# Patient Record
Sex: Male | Born: 1970 | Race: White | Hispanic: No | Marital: Married | Smoking: Current every day smoker
Health system: Southern US, Community
[De-identification: ages and names within clinical notes are randomized; demographics above are authoritative.]

## PROBLEM LIST (undated history)

## (undated) DIAGNOSIS — J449 Chronic obstructive pulmonary disease, unspecified: Secondary | ICD-10-CM

## (undated) DIAGNOSIS — F32A Depression, unspecified: Secondary | ICD-10-CM

## (undated) HISTORY — DX: Depression, unspecified: F32.A

---

## 1993-05-15 HISTORY — PX: BACK SURGERY: SHX140

## 2019-01-01 ENCOUNTER — Emergency Department
Admission: EM | Admit: 2019-01-01 | Discharge: 2019-01-01 | Disposition: A | Discharge status: Home / Self Care | Payer: 59 | Attending: Emergency Medicine | Admitting: Emergency Medicine

## 2019-01-01 ENCOUNTER — Encounter: Payer: Self-pay | Admitting: Emergency Medicine

## 2019-01-01 ENCOUNTER — Other Ambulatory Visit: Payer: Self-pay

## 2019-01-01 DIAGNOSIS — M545 Low back pain: Secondary | ICD-10-CM | POA: Diagnosis present

## 2019-01-01 DIAGNOSIS — M5136 Other intervertebral disc degeneration, lumbar region: Secondary | ICD-10-CM

## 2019-01-01 DIAGNOSIS — M62830 Muscle spasm of back: Secondary | ICD-10-CM | POA: Diagnosis not present

## 2019-01-01 MED ORDER — HYDROMORPHONE HCL 1 MG/ML IJ SOLN
1.0000 mg | Freq: Once | INTRAMUSCULAR | Status: AC
  Administered 2019-01-01: 1 mg via INTRAMUSCULAR
  Filled 2019-01-01: qty 1

## 2019-01-01 MED ORDER — METHOCARBAMOL 500 MG PO TABS
500.0000 mg | ORAL_TABLET | Freq: Once | ORAL | Status: AC
  Administered 2019-01-01: 500 mg via ORAL
  Filled 2019-01-01 (×2): qty 1

## 2019-01-01 MED ORDER — KETOROLAC TROMETHAMINE 60 MG/2ML IM SOLN
60.0000 mg | Freq: Once | INTRAMUSCULAR | Status: AC
  Administered 2019-01-01: 60 mg via INTRAMUSCULAR
  Filled 2019-01-01: qty 2

## 2019-01-01 MED ORDER — IBUPROFEN 600 MG PO TABS: 600.0000 mg | ORAL_TABLET | Freq: Three times a day (TID) | ORAL | 0 refills | Status: DC | PRN

## 2019-01-01 MED ORDER — DEXAMETHASONE SODIUM PHOSPHATE 10 MG/ML IJ SOLN
10.0000 mg | Freq: Once | INTRAMUSCULAR | Status: AC
  Administered 2019-01-01: 10 mg via INTRAMUSCULAR
  Filled 2019-01-01: qty 1

## 2019-01-01 MED ORDER — METHOCARBAMOL 500 MG PO TABS: 500.0000 mg | ORAL_TABLET | Freq: Three times a day (TID) | ORAL | 0 refills | Status: DC | PRN

## 2019-01-01 NOTE — ED Provider Notes (Signed)
[facility] Emergency Department Provider Note  ____________________________________________   First MD Initiated Contact with Patient 01/01/19 0147     (approximate)  I have reviewed the triage vital signs and the nursing notes.   HISTORY  Chief Complaint Back Pain    HPI Eric Soto is a 48 y.o. male  Here with back pain. Pt reports that his sx began this afternoon. He reports he has a long ho back pain 2/2 lumbar disc disease. He has been working more lately and this afternoon, developed gradual onset of progressively worsening aching, throbbing, bilateral back pain. He feels spasm like pain of his b/l paraspinal muscles and it shoots into his left leg, which is common during these episodes. Denies any new LE weakness, numbness. No loss of bowel or bladder continence. No recent falls or trauma. No perianal numbness. No fever, chills. No h/o malignancy or IVDU.        History reviewed. No pertinent past medical history.  There are no active problems to display for this patient.   Past Surgical History:  Procedure Laterality Date   BACK SURGERY      Prior to Admission medications   Medication Sig Start Date End Date Taking? Authorizing Provider  ibuprofen (ADVIL) 600 MG tablet Take 1 tablet (600 mg total) by mouth every 8 (eight) hours as needed for moderate pain (back pain). 01/01/19   Shaune PollackIsaacs, Aviraj Kentner, MD  methocarbamol (ROBAXIN) 500 MG tablet Take 1 tablet (500 mg total) by mouth every 8 (eight) hours as needed for muscle spasms. 01/01/19   Shaune PollackIsaacs, Balin Vandegrift, MD    Allergies Penicillins  No family history on file.  Social History Social History   Tobacco Use   Smoking status: Not on file  Substance Use Topics   Alcohol use: Not on file   Drug use: Not on file    Review of Systems  Review of Systems  Constitutional: Negative for chills, fatigue and fever.  HENT: Negative for sore throat.   Respiratory: Negative for  shortness of breath.   Cardiovascular: Negative for chest pain.  Gastrointestinal: Negative for abdominal pain.  Genitourinary: Negative for flank pain.  Musculoskeletal: Positive for arthralgias and back pain. Negative for neck pain.  Skin: Negative for rash and wound.  Allergic/Immunologic: Negative for immunocompromised state.  Neurological: Negative for weakness and numbness.  Hematological: Does not bruise/bleed easily.  All other systems reviewed and are negative.    ____________________________________________  PHYSICAL EXAM:      VITAL SIGNS: ED Triage Vitals  Enc Vitals Group     BP 01/01/19 0026 115/73     Pulse Rate 01/01/19 0026 78     Resp 01/01/19 0026 18     Temp 01/01/19 0026 97.9 F (36.6 C)     Temp Source 01/01/19 0026 Oral     SpO2 01/01/19 0026 98 %     Weight 01/01/19 0027 195 lb (88.5 kg)     Height 01/01/19 0027 5\' 11"  (1.803 m)     Head Circumference --      Peak Flow --      Pain Score 01/01/19 0025 8     Pain Loc --      Pain Edu? --      Excl. in GC? --      Physical Exam Vitals signs and nursing note reviewed.  Constitutional:      General: He is not in acute distress.    Appearance: He is well-developed.  HENT:  Head: Normocephalic and atraumatic.  Eyes:     Conjunctiva/sclera: Conjunctivae normal.  Neck:     Musculoskeletal: Neck supple.  Cardiovascular:     Rate and Rhythm: Normal rate and regular rhythm.     Heart sounds: Normal heart sounds.  Pulmonary:     Effort: Pulmonary effort is normal. No respiratory distress.     Breath sounds: No wheezing.  Abdominal:     General: There is no distension.  Skin:    General: Skin is warm.     Capillary Refill: Capillary refill takes less than 2 seconds.     Findings: No rash.  Neurological:     Mental Status: He is alert and oriented to person, place, and time.     Motor: No abnormal muscle tone.      Spine Exam: Inspection/Palpation: Left > right paraspinal muscle lumbar  spasm, with significant TTP.  Strength: 5/5 throughout LE bilaterally (hip flexion/extension, adduction/abduction; knee flexion/extension; foot dorsiflexion/plantarflexion, inversion/eversion; great toe inversion) Sensation: Intact to light touch in proximal and distal LE bilaterally Reflexes: 2+ quadriceps and achilles reflexes  ___________________________________________   LABS (all labs ordered are listed, but only abnormal results are displayed)  Labs Reviewed - No data to display  ____________________________________________  EKG: None ________________________________________  RADIOLOGY All imaging, including plain films, CT scans, and ultrasounds, independently reviewed by me, and interpretations confirmed via formal radiology reads.  ED MD interpretation:   None  Official radiology report(s): No results found.  ____________________________________________  PROCEDURES   Procedure(s) performed (including Critical Care):  Procedures  ____________________________________________  INITIAL IMPRESSION / MDM / ASSESSMENT AND PLAN / ED COURSE  As part of my medical decision making, I reviewed the following data within the electronic MEDICAL RECORD NUMBER Notes from prior ED visits and [place] Controlled Substance Database      *Eric Soto was evaluated in Emergency Department on 01/01/2019 for the symptoms described in the history of present illness. He was evaluated in the context of the global COVID-19 pandemic, which necessitated consideration that the patient might be at risk for infection with the SARS-CoV-2 virus that causes COVID-19. Institutional protocols and algorithms that pertain to the evaluation of patients at risk for COVID-19 are in a state of rapid change based on information released by regulatory bodies including the CDC and federal and state organizations. These policies and algorithms were followed during the patient's care in the ED.  Some ED evaluations  and interventions may be delayed as a result of limited staffing during the pandemic.*   Clinical Course as of Jan 01 235  Wed Jan 01, 2019  0204 48 yo M here with paraspinal back spasm and pain, with h/o lumbar disc disease s/p laminectomies. No new LE weakness, numbness, or sx to suggest new cord compression or cauda equina. He is afebrile w/ no signs of infection. No recent falls, trauma to suggest bony abnormality and do not feel plain films indicated. H/o similar episodes from light lifting episodes. Will tx with analgesics, muscle relaxants and refer for outpt follow-up. No red flags.   [CI]    Clinical Course User Index [CI] Shaune PollackIsaacs, Sequoyah Ramone, MD    Medical Decision Making: As above.  ____________________________________________  FINAL CLINICAL IMPRESSION(S) / ED DIAGNOSES  Final diagnoses:  Paraspinal muscle spasm  Degenerative lumbar disc     MEDICATIONS GIVEN DURING THIS VISIT:  Medications  HYDROmorphone (DILAUDID) injection 1 mg (has no administration in time range)  ketorolac (TORADOL) injection 60 mg (has no administration in  time range)  methocarbamol (ROBAXIN) tablet 500 mg (has no administration in time range)  dexamethasone (DECADRON) injection 10 mg (has no administration in time range)     ED Discharge Orders         Ordered    ibuprofen (ADVIL) 600 MG tablet  Every 8 hours PRN     01/01/19 0229    methocarbamol (ROBAXIN) 500 MG tablet  Every 8 hours PRN     01/01/19 0229           Note:  This document was prepared using Dragon voice recognition software and may include unintentional dictation errors.   Duffy Bruce, MD 01/01/19 [id number]

## 2019-01-01 NOTE — ED Triage Notes (Signed)
Patient ambulatory to triage with steady gait, without difficulty or distress noted, mask in place; pt reports back spasms today; st hx lower back surgeries and "happens once or twice a year"; denies any specifc injury but reports "may have aggravated it at work"

## 2020-09-14 ENCOUNTER — Encounter: Payer: Self-pay | Admitting: Emergency Medicine

## 2020-09-14 ENCOUNTER — Other Ambulatory Visit: Payer: Self-pay

## 2020-09-14 ENCOUNTER — Ambulatory Visit
Admission: EM | Admit: 2020-09-14 | Discharge: 2020-09-14 | Disposition: A | Discharge status: Home / Self Care | Payer: BC Managed Care – PPO

## 2020-09-14 ENCOUNTER — Ambulatory Visit (INDEPENDENT_AMBULATORY_CARE_PROVIDER_SITE_OTHER): Payer: BC Managed Care – PPO

## 2020-09-14 DIAGNOSIS — R059 Cough, unspecified: Secondary | ICD-10-CM | POA: Diagnosis not present

## 2020-09-14 DIAGNOSIS — R0602 Shortness of breath: Secondary | ICD-10-CM

## 2020-09-14 DIAGNOSIS — R911 Solitary pulmonary nodule: Secondary | ICD-10-CM | POA: Diagnosis not present

## 2020-09-14 DIAGNOSIS — J441 Chronic obstructive pulmonary disease with (acute) exacerbation: Secondary | ICD-10-CM | POA: Diagnosis not present

## 2020-09-14 HISTORY — DX: Chronic obstructive pulmonary disease, unspecified: J44.9

## 2020-09-14 MED ORDER — ALBUTEROL SULFATE HFA 108 (90 BASE) MCG/ACT IN AERS: 2.0000 | INHALATION_SPRAY | Freq: Four times a day (QID) | RESPIRATORY_TRACT | 1 refills | Status: DC | PRN

## 2020-09-14 MED ORDER — AEROCHAMBER MV MISC: 2 refills | Status: AC

## 2020-09-14 MED ORDER — AZITHROMYCIN 250 MG PO TABS: 250.0000 mg | ORAL_TABLET | Freq: Every day | ORAL | 0 refills | Status: DC

## 2020-09-14 MED ORDER — BUDESONIDE-FORMOTEROL FUMARATE 80-4.5 MCG/ACT IN AERO: 2.0000 | INHALATION_SPRAY | Freq: Two times a day (BID) | RESPIRATORY_TRACT | 12 refills | Status: DC

## 2020-09-14 MED ORDER — IPRATROPIUM BROMIDE 0.06 % NA SOLN: 2.0000 | Freq: Four times a day (QID) | NASAL | 12 refills | Status: AC

## 2020-09-14 NOTE — ED Triage Notes (Addendum)
Patient states he has a history of COPD. He states "I have severe fluid in my lungs." He is reporting SOB and states he has been doing his inhaler and his breathing treatments with no relief x 2 weeks.

## 2020-09-14 NOTE — ED Provider Notes (Signed)
[facility]    CSN: [id number] Arrival date & time: 09/14/20  1502      History   Chief Complaint Chief Complaint  Patient presents with  . Shortness of Breath    HPI Eric Soto is a 50 y.o. male.   HPI   50 year old male here for evaluation of shortness of breath.  Patient reports that he has been having increasing shortness of breath for the past 2 weeks.  He has been using his albuterol inhaler and his nebulizer without any relief.  Patient reports that he has been using his inhaler up to once an hour.  He states that he has had a runny nose with nasal congestion and a cough that happens when he lays down at night.  He is also had intermittent wheezing.  Patient denies fever or sore throat.  Patient has a diagnosis of COPD and still smokes 1/2 to 1 pack/day.  Have PCP or pulmonologist.  Past Medical History:  Diagnosis Date  . COPD (chronic obstructive pulmonary disease) (HCC)     There are no problems to display for this patient.   Past Surgical History:  Procedure Laterality Date  . BACK SURGERY         Home Medications    Prior to Admission medications   Medication Sig Start Date End Date Taking? Authorizing Provider  albuterol (ACCUNEB) 1.25 MG/3ML nebulizer solution Take 1 ampule by nebulization every 6 (six) hours as needed for wheezing.   Yes [provider]  azithromycin (ZITHROMAX Z-PAK) 250 MG tablet Take 1 tablet (250 mg total) by mouth daily. Take 2 tablets on the first day and then 1 tablet daily thereafter for a total of 5 days of treatment. 09/14/20  Yes Becky Augusta, NP  budesonide-formoterol [facility]) 80-4.5 MCG/ACT inhaler Inhale 2 puffs into the lungs 2 (two) times daily. 09/14/20  Yes Becky Augusta, NP  ipratropium (ATROVENT) 0.06 % nasal spray Place 2 sprays into both nostrils 4 (four) times daily. 09/14/20  Yes Becky Augusta, NP  Spacer/Aero-Holding Deretha Emory (AEROCHAMBER MV) inhaler Use as instructed 09/14/20  Yes Becky Augusta, NP  albuterol (VENTOLIN HFA) 108 (90 Base) MCG/ACT inhaler Inhale 2 puffs into the lungs every 6 (six) hours as needed for wheezing or shortness of breath. 09/14/20   Becky Augusta, NP    Family History History reviewed. No pertinent family history.  Social History Social History   Tobacco Use  . Smoking status: Current Every Day Smoker    Packs/day: 1.00  . Smokeless tobacco: Never Used  Substance Use Topics  . Alcohol use: Yes  . Drug use: Yes    Types: Marijuana     Allergies   Penicillins   Review of Systems Review of Systems  Constitutional: Negative for activity change, appetite change and fever.  HENT: Positive for congestion, postnasal drip and rhinorrhea. Negative for sore throat.   Respiratory: Positive for cough, shortness of breath and wheezing.   Cardiovascular: Negative for chest pain.  Hematological: Negative.   Psychiatric/Behavioral: Negative.      Physical Exam Triage Vital Signs ED Triage Vitals  Enc Vitals Group     BP 09/14/20 1513 (!) 156/110     Pulse Rate 09/14/20 1513 72     Resp 09/14/20 1513 18     Temp 09/14/20 1513 98.3 F (36.8 C)     Temp Source 09/14/20 1513 Oral     SpO2 09/14/20 1513 99 %     Weight 09/14/20 1510 185 lb (  83.9 kg)     Height 09/14/20 1510 5\' 11"  (1.803 m)     Head Circumference --      Peak Flow --      Pain Score 09/14/20 1510 0     Pain Loc --      Pain Edu? --      Excl. in GC? --    No data found.  Updated Vital Signs BP (!) 156/110 (BP Location: Left Arm)   Pulse 72   Temp 98.3 F (36.8 C) (Oral)   Resp 18   Ht 5\' 11"  (1.803 m)   Wt 185 lb (83.9 kg)   SpO2 99%   BMI 25.80 kg/m   Visual Acuity Right Eye Distance:   Left Eye Distance:   Bilateral Distance:    Right Eye Near:   Left Eye Near:    Bilateral Near:     Physical Exam Vitals and nursing note reviewed.  Constitutional:      General: He is not in acute distress.    Appearance: Normal appearance. He is well-developed and  normal weight. He is not ill-appearing.  HENT:     Head: Normocephalic and atraumatic.     Right Ear: Tympanic membrane, ear canal and external ear normal.     Left Ear: Tympanic membrane, ear canal and external ear normal.     Nose: Congestion and rhinorrhea present.     Mouth/Throat:     Mouth: Mucous membranes are moist.     Pharynx: Posterior oropharyngeal erythema present.  Cardiovascular:     Rate and Rhythm: Normal rate and regular rhythm.     Pulses: Normal pulses.     Heart sounds: Normal heart sounds. No murmur heard. No gallop.   Pulmonary:     Effort: Pulmonary effort is normal.     Breath sounds: Wheezing present. No rales.  Musculoskeletal:     Cervical back: Normal range of motion and neck supple.  Lymphadenopathy:     Cervical: No cervical adenopathy.  Skin:    General: Skin is warm and dry.     Capillary Refill: Capillary refill takes less than 2 seconds.     Findings: No erythema or rash.  Neurological:     General: No focal deficit present.     Mental Status: He is alert and oriented to person, place, and time.  Psychiatric:        Mood and Affect: Mood normal.        Behavior: Behavior normal.        Thought Content: Thought content normal.        Judgment: Judgment normal.      UC Treatments / Results  Labs (all labs ordered are listed, but only abnormal results are displayed) Labs Reviewed - No data to display  EKG   Radiology DG Chest 2 View  Result Date: 09/14/2020 CLINICAL DATA:  Cough.  Shortness of breath. EXAM: CHEST - 2 VIEW COMPARISON:  No prior. FINDINGS: Mediastinum and hilar structures normal. Heart size normal. Calcified pulmonary nodules consistent with granulomas. No pleural effusion or pneumothorax. IMPRESSION: No acute cardiopulmonary disease. Calcified pulmonary nodules consistent with granulomas. Electronically Signed   By:  Register   On: 09/14/2020 15:43    Procedures Procedures (including critical care  time)  Medications Ordered in UC Medications - No data to display  Initial Impression / Assessment and Plan / UC Course  I have reviewed the triage vital signs and the nursing notes.  Pertinent labs &  imaging results that were available during my care of the patient were reviewed by me and considered in my medical decision making (see chart for details).   Patient is here for evaluation of increasing shortness of breath has been going on for the past 2 weeks and not responding to his nebulizer and albuterol MDI treatments at home.  He is using his MDI up to every hour because he cannot catch his breath.  He has had a cough but he states it mainly starts when he lays down at night because of postnasal drip.  He has had some associated runny nose and nasal congestion and wheezing.  Patient is a smoker and carries a diagnosis of COPD but is not currently being managed by primary care provider or pulmonology.  Patient's physical exam reveals pearly gray tympanic membranes bilaterally with a normal light reflex and clear external auditory canals.  Nasal mucosa is pale and edematous with clear nasal discharge.  Posterior oropharynx has mild erythema with clear postnasal drip.  No cervical lymphadenopathy.  Patient does have wheezes in all lung fields.  Chest x-ray collected at triage.  Radiology interpretation of chest x-ray is that it it is negative for acute intrathoracic process.  There is the presence of calcified pulmonary nodules most consistent with granulomas.  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  Patient's exam is consistent with a COPD exacerbation.  Will cover patient for potential bacterial sources with azithromycin, refill his albuterol MDI, provide prescription for spacer and Symbicort.  We will also make referral to find patient a primary care provider to manage his COPD.   Final Clinical Impressions(s) / UC Diagnoses   Final  diagnoses:  COPD exacerbation [facility])     Discharge Instructions     Your exam and x-rays today are consistent with a COPD exacerbation.  Take the azithromycin as directed, 2 tablets today followed by 1 tablet each day thereafter for total of 5 days.  Continue to use the albuterol inhaler, with the spacer, 2 puffs every 4-6 hours as needed for shortness of breath.  Use the Symbicort inhaler with your spacer, 2 puffs twice daily, to decrease inflammation in your lungs and provide better control of your COPD.  Rinse your mouth or brush her teeth after use the Symbicort as it has a steroid which can cause tooth decay or thrush.  I have made a referral to our coordinator to help you find a primary care provider to manage your COPD.    ED Prescriptions    Medication Sig Dispense Auth. Provider   albuterol (VENTOLIN HFA) 108 (90 Base) MCG/ACT inhaler Inhale 2 puffs into the lungs every 6 (six) hours as needed for wheezing or shortness of breath. 18 g Becky Augusta, NP   Spacer/Aero-Holding Chambers (AEROCHAMBER MV) inhaler Use as instructed 1 each Becky Augusta, NP   ipratropium (ATROVENT) 0.06 % nasal spray Place 2 sprays into both nostrils 4 (four) times daily. 15 mL Becky Augusta, NP   budesonide-formoterol (SYMBICORT) 80-4.5 MCG/ACT inhaler Inhale 2 puffs into the lungs 2 (two) times daily. 1 each Becky Augusta, NP   azithromycin (ZITHROMAX Z-PAK) 250 MG tablet Take 1 tablet (250 mg total) by mouth daily. Take 2 tablets on the first day and then 1 tablet daily thereafter for a total of 5 days of treatment. 6 tablet Becky Augusta, NP     PDMP not reviewed this encounter.   Becky Augusta, NP 09/14/20 1615

## 2020-09-14 NOTE — Discharge Instructions (Signed)
Your exam and x-rays today are consistent with a COPD exacerbation.  Take the azithromycin as directed, 2 tablets today followed by 1 tablet each day thereafter for total of 5 days.  Continue to use the albuterol inhaler, with the spacer, 2 puffs every 4-6 hours as needed for shortness of breath.  Use the Symbicort inhaler with your spacer, 2 puffs twice daily, to decrease inflammation in your lungs and provide better control of your COPD.  Rinse your mouth or brush her teeth after use the Symbicort as it has a steroid which can cause tooth decay or thrush.  I have made a referral to our coordinator to help you find a primary care provider to manage your COPD.

## 2020-11-03 ENCOUNTER — Other Ambulatory Visit: Payer: Self-pay

## 2020-11-03 ENCOUNTER — Encounter: Payer: Self-pay | Admitting: Emergency Medicine

## 2020-11-03 ENCOUNTER — Ambulatory Visit
Admission: EM | Admit: 2020-11-03 | Discharge: 2020-11-03 | Disposition: A | Discharge status: Home / Self Care | Payer: BC Managed Care – PPO | Attending: Sports Medicine | Admitting: Sports Medicine

## 2020-11-03 DIAGNOSIS — J441 Chronic obstructive pulmonary disease with (acute) exacerbation: Secondary | ICD-10-CM | POA: Diagnosis not present

## 2020-11-03 MED ORDER — PREDNISONE 50 MG PO TABS: ORAL_TABLET | ORAL | 0 refills | Status: DC

## 2020-11-03 MED ORDER — BENZONATATE 100 MG PO CAPS: 200.0000 mg | ORAL_CAPSULE | Freq: Three times a day (TID) | ORAL | 0 refills | Status: DC

## 2020-11-03 MED ORDER — AZITHROMYCIN 250 MG PO TABS: 250.0000 mg | ORAL_TABLET | Freq: Every day | ORAL | 0 refills | Status: DC

## 2020-11-03 NOTE — ED Provider Notes (Signed)
[facility]    CSN: [id number] Arrival date & time: 11/03/20  1539      History   Chief Complaint Chief Complaint  Patient presents with   Shortness of Breath    HPI Eric Soto is a 50 y.o. male.   HPI  50 year old male here for evaluation of respiratory complaints.  Patient reports that he has been having increased shortness of breath, chest tightness, and productive cough for thick white sputum.  He also has some thick white nasal discharge in the morning when he blows his nose.  This has been coupled with wheezing that is worse when he is in the heat.  He denies fever, runny nose, or ear pain.  Patient was evaluated back in the beginning of May and diagnosed with COPD and placed on Symbicort, Atrovent nasal spray, and albuterol.  He reports that he has been using the medications with limited return.  Past Medical History:  Diagnosis Date   COPD (chronic obstructive pulmonary disease) (HCC)     There are no problems to display for this patient.   Past Surgical History:  Procedure Laterality Date   BACK SURGERY         Home Medications    Prior to Admission medications   Medication Sig Start Date End Date Taking? Authorizing Provider  benzonatate (TESSALON) 100 MG capsule Take 2 capsules (200 mg total) by mouth every 8 (eight) hours. 11/03/20  Yes Becky Augusta, NP  predniSONE (DELTASONE) 50 MG tablet 1 tablet daily with breakfast for 7 days. 11/03/20  Yes Becky Augusta, NP  albuterol (ACCUNEB) 1.25 MG/3ML nebulizer solution Take 1 ampule by nebulization every 6 (six) hours as needed for wheezing.    [provider]  albuterol (VENTOLIN HFA) 108 (90 Base) MCG/ACT inhaler Inhale 2 puffs into the lungs every 6 (six) hours as needed for wheezing or shortness of breath. 09/14/20   Becky Augusta, NP  azithromycin (ZITHROMAX Z-PAK) 250 MG tablet Take 1 tablet (250 mg total) by mouth daily. Take 2 tablets on the first day and then 1 tablet daily  thereafter for a total of 5 days of treatment. 11/03/20   Becky Augusta, NP  budesonide-formoterol [facility]) 80-4.5 MCG/ACT inhaler Inhale 2 puffs into the lungs 2 (two) times daily. 09/14/20   Becky Augusta, NP  ipratropium (ATROVENT) 0.06 % nasal spray Place 2 sprays into both nostrils 4 (four) times daily. 09/14/20   Becky Augusta, NP  Spacer/Aero-Holding Deretha Emory (AEROCHAMBER MV) inhaler Use as instructed 09/14/20   Becky Augusta, NP    Family History History reviewed. No pertinent family history.  Social History Social History   Tobacco Use   Smoking status: Every Day    Packs/day: 1.00    Pack years: 0.00    Types: Cigarettes   Smokeless tobacco: Never  Vaping Use   Vaping Use: Never used  Substance Use Topics   Alcohol use: Yes   Drug use: Yes    Types: Marijuana     Allergies   Bee venom and Penicillins   Review of Systems Review of Systems  Constitutional:  Negative for activity change, appetite change, fatigue and fever.  HENT:  Positive for congestion. Negative for ear pain, rhinorrhea and sore throat.   Respiratory:  Positive for cough, chest tightness, shortness of breath and wheezing.     Physical Exam Triage Vital Signs ED Triage Vitals  Enc Vitals Group     BP 11/03/20 1706 (!) 143/89     Pulse Rate  11/03/20 1706 70     Resp 11/03/20 1706 (!) 22     Temp 11/03/20 1706 98.8 F (37.1 C)     Temp Source 11/03/20 1706 Oral     SpO2 11/03/20 1706 100 %     Weight --      Height --      Head Circumference --      Peak Flow --      Pain Score 11/03/20 1708 0     Pain Loc --      Pain Edu? --      Excl. in GC? --    No data found.  Updated Vital Signs BP (!) 143/89 (BP Location: Right Arm)   Pulse 70   Temp 98.8 F (37.1 C) (Oral)   Resp (!) 22   SpO2 100%   Visual Acuity Right Eye Distance:   Left Eye Distance:   Bilateral Distance:    Right Eye Near:   Left Eye Near:    Bilateral Near:     Physical Exam Vitals and nursing note reviewed.   Constitutional:      General: He is not in acute distress.    Appearance: Normal appearance. He is normal weight. He is not ill-appearing.  HENT:     Head: Normocephalic and atraumatic.     Right Ear: Tympanic membrane, ear canal and external ear normal. There is no impacted cerumen.     Left Ear: Tympanic membrane, ear canal and external ear normal. There is no impacted cerumen.     Nose: Nose normal. No congestion or rhinorrhea.     Mouth/Throat:     Mouth: Mucous membranes are moist.     Pharynx: Oropharynx is clear. No posterior oropharyngeal erythema.  Cardiovascular:     Rate and Rhythm: Normal rate and regular rhythm.     Pulses: Normal pulses.     Heart sounds: Normal heart sounds. No murmur heard.   No gallop.  Pulmonary:     Effort: Pulmonary effort is normal.     Breath sounds: Wheezing and rhonchi present. No rales.  Skin:    General: Skin is warm and dry.     Capillary Refill: Capillary refill takes less than 2 seconds.     Findings: No erythema or rash.  Neurological:     General: No focal deficit present.     Mental Status: He is alert and oriented to person, place, and time.  Psychiatric:        Mood and Affect: Mood normal.        Behavior: Behavior normal.        Thought Content: Thought content normal.        Judgment: Judgment normal.     UC Treatments / Results  Labs (all labs ordered are listed, but only abnormal results are displayed) Labs Reviewed - No data to display  EKG   Radiology No results found.  Procedures Procedures (including critical care time)  Medications Ordered in UC Medications - No data to display  Initial Impression / Assessment and Plan / UC Course  I have reviewed the triage vital signs and the nursing notes.  Pertinent labs & imaging results that were available during my care of the patient were reviewed by me and considered in my medical decision making (see chart for details).  Patient is a very pleasant,  nontoxic 50 year old male here for evaluation of respiratory complaints as outlined in HPI above.  Physical exam reveals a benign upper respiratory  exam.  Cardiopulmonary exam reveals normal heart sounds and lung sounds have diffuse wheezing and rhonchi in all fields.  Patient has been using his rescue inhaler, maintenance inhaler, and Atrovent nasal spray with diminishing return on respiratory symptom improvement.  Patient's physical exam closely mirrors his exam from early May.  We will treat patient for COPD exacerbation with azithromycin to help decrease pulmonary inflammation, prednisone 50 mg daily for 7 days, and Tessalon Perles to help with cough.  We will have patient continue his maintenance and rescue inhaler.  I have also advised patient to increase his oral fluid intake to thin his mucus out and to take plain Mucinex to help break up the tenacity of his mucus and help him expectorate the phlegm in his chest.  Patient verbalizes understanding of same.  Patient also advised to keep his follow-up appointment for August with pulmonology.   Final Clinical Impressions(s) / UC Diagnoses   Final diagnoses:  COPD exacerbation (HCC)     Discharge Instructions      Continue to use your symbicort and Albuterol to help with wheezing and shortness of breath.  Start the Prednisone tomorrow morning and take it each morning with breakfast for 7 days.  Use the Tessalon perles every 8 hours for cough.  Increase your oral fluid intake to thin your mucous.  Take plain OTC Mucinex 4 times a day to break up how sticky your mucous is.  Keep your appointment with Pulmonology.     ED Prescriptions     Medication Sig Dispense Auth. Provider   azithromycin (ZITHROMAX Z-PAK) 250 MG tablet Take 1 tablet (250 mg total) by mouth daily. Take 2 tablets on the first day and then 1 tablet daily thereafter for a total of 5 days of treatment. 6 tablet Becky Augusta, NP   predniSONE (DELTASONE) 50 MG tablet 1  tablet daily with breakfast for 7 days. 7 tablet Becky Augusta, NP   benzonatate (TESSALON) 100 MG capsule Take 2 capsules (200 mg total) by mouth every 8 (eight) hours. 21 capsule Becky Augusta, NP      PDMP not reviewed this encounter.   Becky Augusta, NP 11/03/20 1746

## 2020-11-03 NOTE — Discharge Instructions (Addendum)
Continue to use your symbicort and Albuterol to help with wheezing and shortness of breath.  Start the Prednisone tomorrow morning and take it each morning with breakfast for 7 days.  Use the Tessalon perles every 8 hours for cough.  Increase your oral fluid intake to thin your mucous.  Take plain OTC Mucinex 4 times a day to break up how sticky your mucous is.  Keep your appointment with Pulmonology.

## 2020-11-03 NOTE — ED Triage Notes (Signed)
Pt  presents today with c/o SOB. "I have COPD and my lungs are full of fluid". He reports being seen here in May and was given a referral to COPD clinic, appt sched for 01/04/21. He has been using inhalers and completed antibiotic given.

## 2021-01-04 ENCOUNTER — Ambulatory Visit: Payer: BC Managed Care – PPO | Admitting: Internal Medicine

## 2021-01-18 ENCOUNTER — Ambulatory Visit: Payer: BC Managed Care – PPO | Admitting: Internal Medicine

## 2021-01-18 ENCOUNTER — Encounter: Payer: Self-pay | Admitting: Internal Medicine

## 2021-01-18 ENCOUNTER — Other Ambulatory Visit: Payer: Self-pay | Admitting: Internal Medicine

## 2021-01-18 ENCOUNTER — Other Ambulatory Visit: Payer: Self-pay

## 2021-01-18 VITALS — BP 138/88 | HR 79 | Temp 98.8°F | Ht 71.0 in | Wt 192.0 lb

## 2021-01-18 DIAGNOSIS — F3181 Bipolar II disorder: Secondary | ICD-10-CM

## 2021-01-18 DIAGNOSIS — Z23 Encounter for immunization: Secondary | ICD-10-CM

## 2021-01-18 DIAGNOSIS — R059 Cough, unspecified: Secondary | ICD-10-CM | POA: Diagnosis not present

## 2021-01-18 DIAGNOSIS — M5416 Radiculopathy, lumbar region: Secondary | ICD-10-CM

## 2021-01-18 DIAGNOSIS — F17209 Nicotine dependence, unspecified, with unspecified nicotine-induced disorders: Secondary | ICD-10-CM | POA: Diagnosis not present

## 2021-01-18 MED ORDER — BUDESONIDE-FORMOTEROL FUMARATE 160-4.5 MCG/ACT IN AERO: 2.0000 | INHALATION_SPRAY | Freq: Two times a day (BID) | RESPIRATORY_TRACT | 3 refills | Status: AC

## 2021-01-18 MED ORDER — ALBUTEROL SULFATE HFA 108 (90 BASE) MCG/ACT IN AERS: 2.0000 | INHALATION_SPRAY | Freq: Four times a day (QID) | RESPIRATORY_TRACT | 1 refills | Status: AC | PRN

## 2021-01-18 NOTE — Progress Notes (Signed)
Date:  01/18/2021   Name:  Eric Soto   DOB:  04/12/1971   MRN:  [id number]   Chief Complaint: Establish Care, Flu Vaccine, Depression, and Anxiety  Depression        This is a chronic problem.  Episode onset: many years ago.   The problem occurs constantly.The problem is unchanged.  Associated symptoms include decreased concentration, insomnia, irritable, restlessness and suicidal ideas.  Associated symptoms include no fatigue and no headaches.  Treatments tried: many different medications over the years.  Past compliance problems: side effects to many meds intolerable.  Risk factors include history of suicide attempt.   Past medical history includes bipolar disorder and suicide attempts.   Shortness of Breath This is a chronic problem. The problem has been unchanged. The average episode lasts 10 years. Associated symptoms include sputum production and wheezing. Pertinent negatives include no chest pain, fever, headaches, hemoptysis, leg swelling or orthopnea. The symptoms are aggravated by smoke. Risk factors include smoking. He has tried beta agonist inhalers and steroid inhalers for the symptoms. The treatment provided moderate relief.   No results found for: CREATININE, BUN, NA, K, CL, CO2 No results found for: CHOL, HDL, LDLCALC, LDLDIRECT, TRIG, CHOLHDL No results found for: TSH No results found for: HGBA1C No results found for: WBC, HGB, HCT, MCV, PLT No results found for: ALT, AST, GGT, ALKPHOS, BILITOT   Review of Systems  Constitutional:  Negative for chills, fatigue, fever and unexpected weight change.  HENT:  Negative for trouble swallowing.   Respiratory:  Positive for sputum production, shortness of breath and wheezing. Negative for hemoptysis.   Cardiovascular:  Negative for chest pain, orthopnea and leg swelling.  Musculoskeletal:  Positive for arthralgias and back pain.  Neurological:  Negative for dizziness and headaches.  Psychiatric/Behavioral:  Positive for  decreased concentration, depression and suicidal ideas. The patient is nervous/anxious and has insomnia.    There are no problems to display for this patient.   Allergies  Allergen Reactions   Bee Venom Anaphylaxis   Tape Rash    Requires silk tape, paper tape and steri strips    Penicillins     Past Surgical History:  Procedure Laterality Date   BACK SURGERY  1995    Social History   Tobacco Use   Smoking status: Every Day    Packs/day: 1.00    Years: 34.00    Pack years: 34.00    Types: Cigarettes   Smokeless tobacco: Never  Vaping Use   Vaping Use: Never used  Substance Use Topics   Alcohol use: Yes   Drug use: Yes    Types: Marijuana     Medication list has been reviewed and updated.  Current Meds  Medication Sig   albuterol (ACCUNEB) 1.25 MG/3ML nebulizer solution Take 1 ampule by nebulization every 6 (six) hours as needed for wheezing.   albuterol (VENTOLIN HFA) 108 (90 Base) MCG/ACT inhaler Inhale 2 puffs into the lungs every 6 (six) hours as needed for wheezing or shortness of breath.   ipratropium (ATROVENT) 0.06 % nasal spray Place 2 sprays into both nostrils 4 (four) times daily.   Spacer/Aero-Holding Chambers (AEROCHAMBER MV) inhaler Use as instructed   [DISCONTINUED] budesonide-formoterol (SYMBICORT) 80-4.5 MCG/ACT inhaler Inhale 2 puffs into the lungs 2 (two) times daily.   [DISCONTINUED] methocarbamol (ROBAXIN) 750 MG tablet Take by mouth.   [DISCONTINUED] pregabalin (LYRICA) 75 MG capsule Take by mouth.    PHQ 2/9 Scores 01/18/2021  PHQ - 2  Score 5  PHQ- 9 Score 11    GAD 7 : Generalized Anxiety Score 01/18/2021  Nervous, Anxious, on Edge 3  Control/stop worrying 3  Worry too much - different things 3  Trouble relaxing 3  Restless 2  Easily annoyed or irritable 2  Afraid - awful might happen 2  Total GAD 7 Score 18    BP Readings from Last 3 Encounters:  01/18/21 138/88  11/03/20 (!) 143/89  09/14/20 (!) 156/110    Physical  Exam Constitutional:      General: He is irritable.     Appearance: Normal appearance.  Neck:     Vascular: No carotid bruit.  Cardiovascular:     Rate and Rhythm: Normal rate and regular rhythm.     Pulses: Normal pulses.     Heart sounds: No murmur heard. Pulmonary:     Effort: Pulmonary effort is normal.     Breath sounds: Decreased air movement present.  Musculoskeletal:     Cervical back: Normal range of motion.  Lymphadenopathy:     Cervical: No cervical adenopathy.  Neurological:     Mental Status: He is alert.  Psychiatric:        Attention and Perception: Attention normal.        Mood and Affect: Mood is anxious.        Speech: Speech is rapid and pressured.        Behavior: Behavior normal.        Cognition and Memory: Cognition normal.        Judgment: Judgment normal.    Wt Readings from Last 3 Encounters:  01/18/21 192 lb (87.1 kg)  09/14/20 185 lb (83.9 kg)  01/01/19 195 lb (88.5 kg)    BP 138/88   Pulse 79   Temp 98.8 F (37.1 C) (Oral)   Ht 5\' 11"  (1.803 m)   Wt 192 lb (87.1 kg)   SpO2 97%   BMI 26.78 kg/m   Assessment and Plan: 1. Bipolar 2 disorder (HCC) He has a long history of depression/anxiety and one suicide attempt He is current not actively suicidal but admits he needs help He has some hesitation regarding medications due to negative experiences in the past Recommend 811 - also provided list of local MH providers  2. Tobacco use disorder, continuous Recent CXR showed pulmonary nodules c/w granulomas No clear changes c/w COPD to explain his sx; needs Pulmonary evaluation - Ambulatory referral to Pulmonology  3. Cough in adult He has had some improvement on Symbicort - will increase strength to 160 mg. - Ambulatory referral to Pulmonology - budesonide-formoterol (SYMBICORT) 160-4.5 MCG/ACT inhaler; Inhale 2 puffs into the lungs 2 (two) times daily.  Dispense: 1 each; Refill: 3 - albuterol (VENTOLIN HFA) 108 (90 Base) MCG/ACT inhaler;  Inhale 2 puffs into the lungs every 6 (six) hours as needed for wheezing or shortness of breath.  Dispense: 18 g; Refill: 1  4. Lumbar radiculopathy S/p 2 laminectomies in the 1990's. Was planning repeat surgery earlier this year but the bulging disc has resolved on MRI He has tried one ESI without benefit  5. Need for immunization against influenza - Flu Vaccine QUAD 64mo+IM (Fluarix, Fluzone & Alfiuria Quad PF)   Partially dictated using 5mo. Any errors are unintentional.  Animal nutritionist, MD [facility]  01/18/2021

## 2021-04-25 ENCOUNTER — Ambulatory Visit: Payer: BC Managed Care – PPO | Admitting: Internal Medicine

## 2021-08-21 ENCOUNTER — Emergency Department
Admission: EM | Admit: 2021-08-21 | Discharge: 2021-08-21 | Disposition: A | Discharge status: Home / Self Care | Payer: BC Managed Care – PPO | Attending: Emergency Medicine | Admitting: Emergency Medicine

## 2021-08-21 DIAGNOSIS — J441 Chronic obstructive pulmonary disease with (acute) exacerbation: Secondary | ICD-10-CM | POA: Diagnosis not present

## 2021-08-21 DIAGNOSIS — J301 Allergic rhinitis due to pollen: Secondary | ICD-10-CM | POA: Insufficient documentation

## 2021-08-21 DIAGNOSIS — F17210 Nicotine dependence, cigarettes, uncomplicated: Secondary | ICD-10-CM | POA: Diagnosis not present

## 2021-08-21 DIAGNOSIS — J302 Other seasonal allergic rhinitis: Secondary | ICD-10-CM

## 2021-08-21 DIAGNOSIS — L02212 Cutaneous abscess of back [any part, except buttock]: Secondary | ICD-10-CM | POA: Insufficient documentation

## 2021-08-21 DIAGNOSIS — L0291 Cutaneous abscess, unspecified: Secondary | ICD-10-CM

## 2021-08-21 MED ORDER — LIDOCAINE HCL (PF) 1 % IJ SOLN
5.0000 mL | Freq: Once | INTRAMUSCULAR | Status: AC
  Administered 2021-08-21: 5 mL
  Filled 2021-08-21: qty 5

## 2021-08-21 MED ORDER — HYDROCODONE-ACETAMINOPHEN 5-325 MG PO TABS: 1.0000 | ORAL_TABLET | Freq: Four times a day (QID) | ORAL | 0 refills | Status: DC | PRN

## 2021-08-21 MED ORDER — CLINDAMYCIN HCL 300 MG PO CAPS: 300.0000 mg | ORAL_CAPSULE | Freq: Three times a day (TID) | ORAL | 0 refills | Status: AC

## 2021-08-21 MED ORDER — PREDNISONE 10 MG PO TABS: ORAL_TABLET | ORAL | 0 refills | Status: DC

## 2021-08-21 NOTE — ED Provider Notes (Signed)
? ?[facility] ?Provider Note ? ? ? Event Date/Time  ? First MD Initiated Contact with Patient 08/21/21 1103   ?  (approximate) ? ? ?History  ? ?Abscess ? ? ?HPI ? ?Eric Soto is a 51 y.o. male   presents to the ED with complaint of abscess to his mid back.  Patient states has been bothering him for the last several days and now is complaining about it being warm to touch.  Patient denies any fever or chills.  This is his first abscess.  Patient has history of COPD and depression.  He continues to smoke 1 pack cigarettes per day.  Patient also complains of seasonal allergies which has caused an exacerbation of his COPD.  He states that he used his nebulizer and inhaler prior to arrival.  Generally is necessary for him to take prednisone during the spring due to exacerbation of his COPD. ? ?  ? ? ?Physical Exam  ? ?Triage Vital Signs: ?ED Triage Vitals  ?Enc Vitals Group  ?   BP 08/21/21 0948 (!) 150/92  ?   Pulse Rate 08/21/21 0948 69  ?   Resp 08/21/21 0948 18  ?   Temp 08/21/21 0948 97.8 ?F (36.6 ?C)  ?   Temp Source 08/21/21 0948 Oral  ?   SpO2 08/21/21 0948 99 %  ?   Weight --   ?   Height --   ?   Head Circumference --   ?   Peak Flow --   ?   Pain Score 08/21/21 0944 7  ?   Pain Loc --   ?   Pain Edu? --   ?   Excl. in GC? --   ? ? ?Most recent vital signs: ?Vitals:  ? 08/21/21 0948  ?BP: (!) 150/92  ?Pulse: 69  ?Resp: 18  ?Temp: 97.8 ?F (36.6 ?C)  ?SpO2: 99%  ? ? ? ?General: Awake, no distress.  ?CV:  Good peripheral perfusion.  Heart regular rate and rhythm. ?Resp:  Normal effort.  Lungs without expiratory wheezes at present. ?Abd:  No distention.  ?Other:             There is a infected sebaceous cyst noted to the mid back with erythema and moderate warmth to touch.  Tenderness on palpation.  No open area and no drainage at this time. ? ? ?ED Results / Procedures / Treatments  ? ?Labs ?(all labs ordered are listed, but only abnormal results are displayed) ?Labs Reviewed - No  data to display ? ? ? ?PROCEDURES: ? ?Critical Care performed:  ? ?Marland Kitchen.Incision and Drainage ? ?Date/Time: 08/21/2021 11:19 AM ?Performed by: Tommi Rumps, PA-C ?Authorized by: Tommi Rumps, PA-C  ? ?Consent:  ?  Consent obtained:  Verbal ?  Consent given by:  Patient ?  Risks discussed:  Incomplete drainage and infection ?Universal protocol:  ?  Patient identity confirmed:  Verbally with patient ?Location:  ?  Type:  Abscess ?  Location:  Trunk ?  Trunk location:  Back ?Pre-procedure details:  ?  Skin preparation:  Povidone-iodine ?Anesthesia:  ?  Anesthesia method:  Local infiltration ?  Local anesthetic:  Lidocaine 1% w/o epi ?Procedure type:  ?  Complexity:  Simple ?Procedure details:  ?  Incision types:  Stab incision ?  Incision depth:  Dermal ?  Wound management:  Probed and deloculated ?  Drainage:  Purulent ?  Drainage amount:  Moderate ?  Wound treatment:  Drain  placed ?  Packing materials:  1/4 in iodoform gauze ?Post-procedure details:  ?  Procedure completion:  Tolerated well, no immediate complications ? ? ?MEDICATIONS ORDERED IN ED: ?Medications  ?lidocaine (PF) (XYLOCAINE) 1 % injection 5 mL (5 mLs Infiltration Given by Other 08/21/21 1131)  ? ? ? ?IMPRESSION / MDM / ASSESSMENT AND PLAN / ED COURSE  ?I reviewed the triage vital signs and the nursing notes. ? ? ?Differential diagnosis includes, but is not limited to, abscess, sebaceous cyst.  Seasonal allergies, exacerbation of COPD. ? ? ?51 year old male presents to the ED with abscess to his back that is gradually gotten more red and tender in the last day or 2.  Patient denies any fever or chills.  We discussed open and drainage which is the reason why he is here.  Patient also has history of seasonal allergies which exacerbates his COPD which is usually treated with steroids and he continues using his albuterol at home.  Area was I&D without any difficulty.  Patient is aware that the drain that was placed will need to be removed in 2 days.  We  discussed options of PCP, urgent care or return to the emergency department.  A prescription for clindamycin, Norco and prednisone was sent to his pharmacy.  Patient will follow-up with PCP if any continued problems with his COPD. ? ? ?  ? ? ?FINAL CLINICAL IMPRESSION(S) / ED DIAGNOSES  ? ?Final diagnoses:  ?Abscess  ?Seasonal allergies  ?COPD exacerbation (HCC)  ? ? ? ?Rx / DC Orders  ? ?ED Discharge Orders   ? ?      Ordered  ?  clindamycin (CLEOCIN) 300 MG capsule  3 times daily       ? 08/21/21 1140  ?  HYDROcodone-acetaminophen (NORCO/VICODIN) 5-325 MG tablet  Every 6 hours PRN       ? 08/21/21 1140  ?  predniSONE (DELTASONE) 10 MG tablet       ? 08/21/21 1140  ? ?  ?  ? ?  ? ? ? ?Note:  This document was prepared using Dragon voice recognition software and may include unintentional dictation errors. ?  ?Tommi Rumps, PA-C ?08/21/21 1152 ? ?  ?Jene Every, MD ?08/21/21 1212 ? ?

## 2021-08-21 NOTE — Discharge Instructions (Addendum)
Follow-up with your primary care provider or urgent care to have the drain removed in 2 days.  You may also return to the emergency department if you are unable to have it removed otherwise.  A prescription for clindamycin, Norco and prednisone was sent to the pharmacy.  The Norco is a narcotic and should not be taken while driving or operating machinery.  This is only for pain.  Continue using your albuterol inhaler at home.  Take all of antibiotic until completely finished.  You may also use warm moist compresses to the abscess area which may increase drainage from the area. ?

## 2021-08-21 NOTE — ED Triage Notes (Signed)
Pt c/o "boil on my back" states it is mid back , bothering him for the past several days. ?

## 2021-08-23 ENCOUNTER — Other Ambulatory Visit: Payer: Self-pay

## 2021-08-23 ENCOUNTER — Encounter: Payer: Self-pay | Admitting: Emergency Medicine

## 2021-08-23 ENCOUNTER — Ambulatory Visit
Admission: EM | Admit: 2021-08-23 | Discharge: 2021-08-23 | Disposition: A | Discharge status: Home / Self Care | Payer: BC Managed Care – PPO | Attending: Emergency Medicine | Admitting: Emergency Medicine

## 2021-08-23 DIAGNOSIS — L02212 Cutaneous abscess of back [any part, except buttock]: Secondary | ICD-10-CM | POA: Diagnosis not present

## 2021-08-23 DIAGNOSIS — Z5189 Encounter for other specified aftercare: Secondary | ICD-10-CM | POA: Diagnosis not present

## 2021-08-23 NOTE — ED Provider Notes (Signed)
?[facility] ? ? ? ?CSN: [id number] ?Arrival date & time: 08/23/21  1427 ? ? ?  ? ?History   ?Chief Complaint ?Chief Complaint  ?Patient presents with  ? Abscess  ? ? ?HPI ?Eric Soto is a 51 y.o. male.  ? ?Eric Soto, 51 year old male pt, presents to here for packing removal. Pt had I/D in Er 2 days prior, feeling better, is on antibiotics. No fever. ? ?The history is provided by the patient. No language interpreter was used.  ? ?Past Medical History:  ?Diagnosis Date  ? COPD (chronic obstructive pulmonary disease) (HCC)   ? Depression   ? ? ?Patient Active Problem List  ? Diagnosis Date Noted  ? Encounter for wound care 08/23/2021  ? Cough in adult 01/18/2021  ? Bipolar 2 disorder (HCC) 01/18/2021  ? Tobacco use disorder, continuous 01/18/2021  ? Lumbar radiculopathy 01/18/2021  ? ? ?Past Surgical History:  ?Procedure Laterality Date  ? BACK SURGERY  1995  ? ? ? ? ? ?Home Medications   ? ?Prior to Admission medications   ?Medication Sig Start Date End Date Taking? Authorizing Provider  ?albuterol (ACCUNEB) 1.25 MG/3ML nebulizer solution Take 1 ampule by nebulization every 6 (six) hours as needed for wheezing.   Yes [provider]  ?albuterol (VENTOLIN HFA) 108 (90 Base) MCG/ACT inhaler Inhale 2 puffs into the lungs every 6 (six) hours as needed for wheezing or shortness of breath. 01/18/21  Yes Reubin Milan, MD  ?budesonide-formoterol [facility]) 160-4.5 MCG/ACT inhaler Inhale 2 puffs into the lungs 2 (two) times daily. 01/18/21  Yes Reubin Milan, MD  ?clindamycin (CLEOCIN) 300 MG capsule Take 1 capsule (300 mg total) by mouth 3 (three) times daily for 10 days. 08/21/21 08/31/21 Yes Tommi Rumps, PA-C  ?HYDROcodone-acetaminophen (NORCO/VICODIN) 5-325 MG tablet Take 1 tablet by mouth every 6 (six) hours as needed for moderate pain. 08/21/21 08/21/22 Yes Bridget Hartshorn L, PA-C  ?ipratropium (ATROVENT) 0.06 % nasal spray Place 2 sprays into both nostrils 4 (four) times daily. 09/14/20   Yes Becky Augusta, NP  ?predniSONE (DELTASONE) 10 MG tablet Take 6 tablets  today, on day 2 take 5 tablets, day 3 take 4 tablets, day 4 take 3 tablets, day 5 take  2 tablets and 1 tablet the last day 08/21/21  Yes Tommi Rumps, PA-C  ?Spacer/Aero-Holding Chambers (AEROCHAMBER MV) inhaler Use as instructed 09/14/20  Yes Becky Augusta, NP  ? ? ?Family History ?Family History  ?Problem Relation Age of Onset  ? Cancer Mother   ? Hypertension Father   ? Heart disease Father   ? Cancer Father   ? Diabetes Brother   ? ? ?Social History ?Social History  ? ?Tobacco Use  ? Smoking status: Every Day  ?  Packs/day: 1.00  ?  Years: 34.00  ?  Pack years: 34.00  ?  Types: Cigarettes  ? Smokeless tobacco: Never  ?Vaping Use  ? Vaping Use: Never used  ?Substance Use Topics  ? Alcohol use: Yes  ? Drug use: Yes  ?  Types: Marijuana  ? ? ? ?Allergies   ?Bee venom, Tape, and Penicillins ? ? ?Review of Systems ?Review of Systems  ?Skin:  Positive for wound.  ?All other systems reviewed and are negative. ? ? ?Physical Exam ?Triage Vital Signs ?ED Triage Vitals  ?Enc Vitals Group  ?   BP   ?   Pulse   ?   Resp   ?   Temp   ?  Temp src   ?   SpO2   ?   Weight   ?   Height   ?   Head Circumference   ?   Peak Flow   ?   Pain Score   ?   Pain Loc   ?   Pain Edu?   ?   Excl. in GC?   ? ?No data found. ? ?Updated Vital Signs ?BP 140/86 (BP Location: Right Arm)   Pulse 83   Temp 98.3 ?F (36.8 ?C) (Oral)   Resp 18   Ht  (1.803 m)   Wt 192 lb 0.3 oz (87.1 kg)   SpO2 96%   BMI 26.78 kg/m?  ? ?Visual Acuity ?Right Eye Distance:   ?Left Eye Distance:   ?Bilateral Distance:   ? ?Right Eye Near:   ?Left Eye Near:    ?Bilateral Near:    ? ?Physical Exam ?Vitals and nursing note reviewed.  ?Constitutional:   ?   General: He is not in acute distress. ?   Appearance: He is well-developed.  ?HENT:  ?   Head: Normocephalic and atraumatic.  ?Eyes:  ?   Conjunctiva/sclera: Conjunctivae normal.  ?Cardiovascular:  ?   Rate and Rhythm: Normal rate and  regular rhythm.  ?   Heart sounds: No murmur heard. ?Pulmonary:  ?   Effort: Pulmonary effort is normal. No respiratory distress.  ?   Breath sounds: Normal breath sounds.  ?Abdominal:  ?   Palpations: Abdomen is soft.  ?   Tenderness: There is no abdominal tenderness.  ?Musculoskeletal:     ?   General: No swelling.  ?   Cervical back: Neck supple.  ?Skin: ?   General: Skin is warm and dry.  ?   Capillary Refill: Capillary refill takes less than 2 seconds.  ?   Findings: Abscess and wound present.  ?   Comments: Dressing removed from right upper back/shoulder region where horizontal I/d preformed previously, packing removed, scanty drainage noted, no streaking, no fluctuance. No tenderness.   ?Neurological:  ?   General: No focal deficit present.  ?   Mental Status: He is alert and oriented to person, place, and time.  ?   GCS: GCS eye subscore is 4. GCS verbal subscore is 5. GCS motor subscore is 6.  ?Psychiatric:     ?   Attention and Perception: Attention normal.     ?   Mood and Affect: Mood normal.     ?   Behavior: Behavior normal.  ? ? ? ?UC Treatments / Results  ?Labs ?(all labs ordered are listed, but only abnormal results are displayed) ?Labs Reviewed - No data to display ? ?EKG ? ? ?Radiology ?No results found. ? ?Procedures ?Procedures (including critical care time) ? ?Medications Ordered in UC ?Medications - No data to display ? ?Initial Impression / Assessment and Plan / UC Course  ?I have reviewed the triage vital signs and the nursing notes. ? ?Pertinent labs & imaging results that were available during my care of the patient were reviewed by me and considered in my medical decision making (see chart for details). ? ?  ? ?Ddx: Abscess/wound recheck ?Final Clinical Impressions(s) / UC Diagnoses  ? ?Final diagnoses:  ?Encounter for wound care  ? ? ? ?Discharge Instructions   ? ?  ?Daily dressing changes. Finish antibiotic, follow up with PCP.  ? ? ? ? ?ED Prescriptions   ?None ?  ? ?PDMP not reviewed  this  encounter. ?  ?Clancy Gourd, NP ?08/23/21 1526 ? ?

## 2021-08-23 NOTE — Discharge Instructions (Signed)
Daily dressing changes. Finish antibiotic, follow up with PCP.  ?

## 2021-08-23 NOTE — ED Triage Notes (Signed)
Pt presents to [facility] to have packing removed from a abscess he had I&D 2 days ago. Abscess is located in the middle of his back. He was seen in the ED 2 days ago. He states he is feeling better.  ?

## 2021-08-23 NOTE — ED Notes (Signed)
Plied Telfa and tape to the wound.  ?

## 2021-10-19 ENCOUNTER — Encounter: Payer: Self-pay | Admitting: Medical Oncology

## 2021-10-19 ENCOUNTER — Emergency Department
Admission: EM | Admit: 2021-10-19 | Discharge: 2021-10-19 | Disposition: A | Discharge status: Home / Self Care | Payer: BC Managed Care – PPO | Attending: Emergency Medicine | Admitting: Emergency Medicine

## 2021-10-19 ENCOUNTER — Other Ambulatory Visit: Payer: Self-pay | Admitting: Internal Medicine

## 2021-10-19 DIAGNOSIS — M436 Torticollis: Secondary | ICD-10-CM | POA: Diagnosis not present

## 2021-10-19 DIAGNOSIS — R252 Cramp and spasm: Secondary | ICD-10-CM | POA: Insufficient documentation

## 2021-10-19 DIAGNOSIS — M542 Cervicalgia: Secondary | ICD-10-CM | POA: Diagnosis not present

## 2021-10-19 DIAGNOSIS — J449 Chronic obstructive pulmonary disease, unspecified: Secondary | ICD-10-CM | POA: Diagnosis not present

## 2021-10-19 MED ORDER — KETOROLAC TROMETHAMINE 10 MG PO TABS: 10.0000 mg | ORAL_TABLET | Freq: Four times a day (QID) | ORAL | 0 refills | Status: DC | PRN

## 2021-10-19 MED ORDER — DIAZEPAM 5 MG PO TABS
5.0000 mg | ORAL_TABLET | Freq: Once | ORAL | Status: AC
  Administered 2021-10-19: 5 mg via ORAL
  Filled 2021-10-19: qty 1

## 2021-10-19 MED ORDER — OXYCODONE HCL 5 MG PO TABS
5.0000 mg | ORAL_TABLET | Freq: Once | ORAL | Status: AC
  Administered 2021-10-19: 5 mg via ORAL
  Filled 2021-10-19: qty 1

## 2021-10-19 MED ORDER — HYDROCODONE-ACETAMINOPHEN 5-325 MG PO TABS: 1.0000 | ORAL_TABLET | Freq: Four times a day (QID) | ORAL | 0 refills | Status: AC | PRN

## 2021-10-19 MED ORDER — KETOROLAC TROMETHAMINE 60 MG/2ML IM SOLN
30.0000 mg | Freq: Once | INTRAMUSCULAR | Status: AC
  Administered 2021-10-19: 30 mg via INTRAMUSCULAR
  Filled 2021-10-19: qty 2

## 2021-10-19 MED ORDER — CYCLOBENZAPRINE HCL 10 MG PO TABS: 10.0000 mg | ORAL_TABLET | Freq: Three times a day (TID) | ORAL | 0 refills | Status: DC | PRN

## 2021-10-19 MED ORDER — ORPHENADRINE CITRATE 30 MG/ML IJ SOLN
60.0000 mg | Freq: Two times a day (BID) | INTRAMUSCULAR | Status: DC
  Administered 2021-10-19: 60 mg via INTRAMUSCULAR
  Filled 2021-10-19: qty 2

## 2021-10-19 NOTE — ED Triage Notes (Addendum)
Pt reports for the past 24hrs he has been having neck "spasms" states that it feels like a "crick" in his neck that is causing him to have a headache. Pain with movement.

## 2021-10-19 NOTE — ED Provider Notes (Signed)
[facility] Provider Note    Event Date/Time   First MD Initiated Contact with Patient 10/19/21 [phone]     (approximate)   History   Neck Pain   HPI  Ashir Kunz is a 51 y.o. male presenting to the emergency department for treatment and evaluation of neck spasms.  He states he thinks that he slept wrong a couple months ago and has been unable to turn his head or move.  Tightness and muscle spasms in the neck are causing severe headache.  No relief with lidocaine patch, BC powder, meloxicam, heat or ice.  Past Medical History:  Diagnosis Date   COPD (chronic obstructive pulmonary disease) (HCC)    Depression         Physical Exam   Triage Vital Signs: ED Triage Vitals  Enc Vitals Group     BP 10/19/21 0733 (!) 170/107     Pulse Rate 10/19/21 0733 96     Resp 10/19/21 0733 18     Temp 10/19/21 0733 98.3 F (36.8 C)     Temp Source 10/19/21 0733 Oral     SpO2 10/19/21 0733 98 %     Weight 10/19/21 0734 195 lb (88.5 kg)     Height 10/19/21 0734 5\' 11"  (1.803 m)     Head Circumference --      Peak Flow --      Pain Score 10/19/21 0734 10     Pain Loc --      Pain Edu? --      Excl. in GC? --     Most recent vital signs: Vitals:   10/19/21 0733 10/19/21 0932  BP: (!) 170/107 (!) 156/96  Pulse: 96   Resp: 18   Temp: 98.3 F (36.8 C)   SpO2: 98%     General: Awake, no distress.  CV:  Good peripheral perfusion.  Resp:  Normal effort.  Abd:  No distention.  Other:  Pain induced via rotation of head/neck.   ED Results / Procedures / Treatments   Labs (all labs ordered are listed, but only abnormal results are displayed) Labs Reviewed - No data to display   EKG  Not indicated.   RADIOLOGY  Not indicated.  I have independently reviewed and interpreted imaging as well as reviewed report from radiology.  PROCEDURES:  Critical Care performed: No  Procedures   MEDICATIONS ORDERED IN ED:  Medications   orphenadrine (NORFLEX) injection 60 mg (60 mg Intramuscular Given 10/19/21 0840)  ketorolac (TORADOL) injection 30 mg (30 mg Intramuscular Given 10/19/21 0840)  oxyCODONE (Oxy IR/ROXICODONE) immediate release tablet 5 mg (5 mg Oral Given 10/19/21 0839)  diazepam (VALIUM) tablet 5 mg (5 mg Oral Given 10/19/21 0944)     IMPRESSION / MDM / ASSESSMENT AND PLAN / ED COURSE   I reviewed the triage vital signs and the nursing notes.                              Differential diagnosis includes, but is not limited to: Cervical strain, torticollis, muscle spasm  Patient's presentation is most consistent with acute, uncomplicated illness.  52 year old male presenting to the emergency department for treatment and evaluation of muscle pain and spasms in his upper back, neck, and shoulders.  See HPI for further details.  Patient has significant pain with attempt to rotate head in any direction.  Toradol, Norflex, and 5 mg of Oxy IR ordered.  Clinical Course as of 10/19/21 1024  Wed Oct 19, 2021  0867 On reassessment, headache is resolving. Patient continues to complain of spasms in muscles of neck. Now recalls falling asleep on the couch and his chin was to his chest. [CT]  1023 Much improved after Valium. Patient feels comfortable with discharge home. Rx. Sent to patient's pharmacy. ER return precautions discussed. He is to follow up with primary care if not improving over the next few days. [CT]    Clinical Course User Index [CT] Henleigh Robello B, FNP     FINAL CLINICAL IMPRESSION(S) / ED DIAGNOSES   Final diagnoses:  Acute torticollis     Rx / DC Orders   ED Discharge Orders          Ordered    cyclobenzaprine (FLEXERIL) 10 MG tablet  3 times daily PRN        10/19/21 1021    HYDROcodone-acetaminophen (NORCO/VICODIN) 5-325 MG tablet  Every 6 hours PRN        10/19/21 1021    ketorolac (TORADOL) 10 MG tablet  Every 6 hours PRN       Note to Pharmacy: Injection given in ER   10/19/21  1021             Note:  This document was prepared using Dragon voice recognition software and may include unintentional dictation errors.   Chinita Pester, FNP 10/19/21 1025    Merwyn Katos, MD 10/19/21 [phone]

## 2021-10-19 NOTE — ED Notes (Signed)
Patient discharged to home per MD order. Patient in stable condition, and deemed medically cleared by ED provider for discharge. Discharge instructions reviewed with patient/family using "Teach Back"; verbalized understanding of medication education and administration, and information about follow-up care. Denies further concerns. ° °

## 2021-11-28 ENCOUNTER — Ambulatory Visit
Admission: RE | Admit: 2021-11-28 | Discharge: 2021-11-28 | Disposition: A | Discharge status: Home / Self Care | Payer: BC Managed Care – PPO | Source: Ambulatory Visit | Attending: Family Medicine | Admitting: Family Medicine

## 2021-11-28 ENCOUNTER — Encounter: Payer: Self-pay | Admitting: Family Medicine

## 2021-11-28 ENCOUNTER — Other Ambulatory Visit
Admission: RE | Admit: 2021-11-28 | Discharge: 2021-11-28 | Disposition: A | Discharge status: Home / Self Care | Payer: BC Managed Care – PPO | Source: Home / Self Care | Attending: Family Medicine | Admitting: Family Medicine

## 2021-11-28 ENCOUNTER — Ambulatory Visit
Admission: RE | Admit: 2021-11-28 | Discharge: 2021-11-28 | Disposition: A | Discharge status: Home / Self Care | Payer: BC Managed Care – PPO | Attending: Family Medicine | Admitting: Family Medicine

## 2021-11-28 ENCOUNTER — Ambulatory Visit (INDEPENDENT_AMBULATORY_CARE_PROVIDER_SITE_OTHER): Payer: BC Managed Care – PPO | Admitting: Family Medicine

## 2021-11-28 VITALS — BP 130/88 | HR 88 | Ht 71.0 in | Wt 195.0 lb

## 2021-11-28 DIAGNOSIS — M25474 Effusion, right foot: Secondary | ICD-10-CM

## 2021-11-28 DIAGNOSIS — M25475 Effusion, left foot: Secondary | ICD-10-CM

## 2021-11-28 DIAGNOSIS — M79672 Pain in left foot: Secondary | ICD-10-CM | POA: Diagnosis not present

## 2021-11-28 LAB — URIC ACID: Uric Acid, Serum: 7.1 mg/dL (ref 3.7–8.6)

## 2021-11-28 MED ORDER — TRAMADOL HCL 50 MG PO TABS: 50.0000 mg | ORAL_TABLET | Freq: Three times a day (TID) | ORAL | 0 refills | Status: AC | PRN

## 2021-11-28 MED ORDER — DICLOFENAC SODIUM 75 MG PO TBEC: 75.0000 mg | DELAYED_RELEASE_TABLET | Freq: Two times a day (BID) | ORAL | 0 refills | Status: AC

## 2021-11-28 NOTE — Progress Notes (Signed)
Primary Care / Sports Medicine Office Visit  Patient Information:  Patient ID: Eric Soto, male DOB: 05-23-1970 Age: 51 y.o. MRN: [id number]   Eric Soto is a pleasant 51 y.o. male presenting with the following:  Chief Complaint  Patient presents with   Toe Pain    Big toe on right foot swollen, pain, for 5 days. No known injury, thinks it could be gout. Bought new work boots last week     Vitals:   11/28/21 0958  BP: 130/88  Pulse: 88  SpO2: 98%   Vitals:   11/28/21 0958  Weight: 195 lb (88.5 kg)  Height: 5\' 11"  (1.803 m)   Body mass index is 27.2 kg/m.  No results found.   Independent interpretation of notes and tests performed by another provider:   None  Procedures performed:   None  Pertinent History, Exam, Impression, and Recommendations:   Problem List Items Addressed This Visit       Musculoskeletal and Integument   Swelling of first metatarsophalangeal (MTP) joint of right foot - Primary    Patient presents with acute on chronic right great toe pain with focality to the first MTP.  He states that he has had on and off pain for years to this site which he attributes to his flatfootedness, did note most recent episode after both back-to-back indulgences with hamburgers as well as new work boots.  Pain began and further progressed as he went on a trip where there was increased alcohol usage and increased time on feet using his new boots.  He has had severe pain, swelling, denies erythema, radiation of pain to the dorsal midfoot and medial forefoot/midfoot.  Examination shows pes planus, somewhat generalized swelling with focality of the first MTP, no erythema, range of motion passively full though significantly painful with extension greater than flexion, maximal tenderness at the superomedial aspect of the first MTP, secondary tenderness through the posterior tibialis tendon at the insertion and dorsal midfoot.  Given his clinical history  both degenerative changes at the MTP and associated arthralgia versus gout flare to be considered.  I have advised patient of the same as well as evaluation for management strategies.  Plan for uric acid level, dedicated plain films, treatment with diclofenac and as needed tramadol.      Relevant Medications   diclofenac (VOLTAREN) 75 MG EC tablet   traMADol (ULTRAM) 50 MG tablet   Other Relevant Orders   Uric acid   Other Visit Diagnoses     Swelling of first metatarsophalangeal (MTP) joint of left foot       Relevant Medications   traMADol (ULTRAM) 50 MG tablet   Other Relevant Orders   DG Foot Complete Left        Orders & Medications Meds ordered this encounter  Medications   diclofenac (VOLTAREN) 75 MG EC tablet    Sig: Take 1 tablet (75 mg total) by mouth 2 (two) times daily.    Dispense:  30 tablet    Refill:  0   traMADol (ULTRAM) 50 MG tablet    Sig: Take 1 tablet (50 mg total) by mouth every 8 (eight) hours as needed for up to 5 days.    Dispense:  15 tablet    Refill:  0   Orders Placed This Encounter  Procedures   DG Foot Complete Left   Uric acid     No follow-ups on file.     , MD  [place]

## 2021-11-28 NOTE — Assessment & Plan Note (Signed)
Patient presents with acute on chronic right great toe pain with focality to the first MTP.  He states that he has had on and off pain for years to this site which he attributes to his flatfootedness, did note most recent episode after both back-to-back indulgences with hamburgers as well as new work boots.  Pain began and further progressed as he went on a trip where there was increased alcohol usage and increased time on feet using his new boots.  He has had severe pain, swelling, denies erythema, radiation of pain to the dorsal midfoot and medial forefoot/midfoot.  Examination shows pes planus, somewhat generalized swelling with focality of the first MTP, no erythema, range of motion passively full though significantly painful with extension greater than flexion, maximal tenderness at the superomedial aspect of the first MTP, secondary tenderness through the posterior tibialis tendon at the insertion and dorsal midfoot.  Given his clinical history both degenerative changes at the MTP and associated arthralgia versus gout flare to be considered.  I have advised patient of the same as well as evaluation for management strategies.  Plan for uric acid level, dedicated plain films, treatment with diclofenac and as needed tramadol.

## 2021-12-10 ENCOUNTER — Other Ambulatory Visit: Payer: Self-pay | Admitting: Family Medicine

## 2021-12-10 DIAGNOSIS — M25474 Effusion, right foot: Secondary | ICD-10-CM

## 2021-12-12 NOTE — Telephone Encounter (Signed)
Refill

## 2021-12-12 NOTE — Telephone Encounter (Signed)
Requested medication (s) are due for refill today: no  Requested medication (s) are on the active medication list: yes  Last refill:  11/28/21  Future visit scheduled:yes  Notes to clinic:  Unable to refill per protocol, last refill by provider 11/28/21 for 30 days. Possible duplicate request.     Requested Prescriptions  Pending Prescriptions Disp Refills   diclofenac (VOLTAREN) 75 MG EC tablet [Pharmacy Med Name: DICLOFENAC SODIUM 75MG DR TABLETS] 30 tablet 0    Sig: TAKE 1 TABLET(75 MG) BY MOUTH TWICE DAILY     Analgesics:  NSAIDS Failed - 12/10/2021  3:10 AM      Failed - Manual Review: Labs are only required if the patient has taken medication for more than 8 weeks.      Failed - Cr in normal range and within 360 days    No results found for: "CREATININE", "LABCREAU", "LABCREA", "POCCRE"       Failed - HGB in normal range and within 360 days    No results found for: "HGB", "HGBKUC", "HGBPOCKUC", "HGBOTHER", "TOTHGB", "HGBPLASMA"       Failed - PLT in normal range and within 360 days    No results found for: "PLT", "PLTCOUNTKUC", "LABPLAT", "POCPLA"       Failed - HCT in normal range and within 360 days    No results found for: "HCT", "HCTKUC", "SRHCT"       Failed - eGFR is 30 or above and within 360 days    No results found for: "GFRAA", "GFRNONAA", "GFR", "EGFR"       Passed - Patient is not pregnant      Passed - Valid encounter within last 12 months    Recent Outpatient Visits           2 weeks ago Swelling of first metatarsophalangeal (MTP) joint of right foot   [facility] Montel Culver, MD   10 months ago Bipolar 2 disorder [facility])   [facility] Glean Hess, MD

## 2021-12-14 NOTE — Telephone Encounter (Signed)
Pt phone number not working, will send MyChart message

## 2022-07-02 ENCOUNTER — Emergency Department
Admission: EM | Admit: 2022-07-02 | Discharge: 2022-07-02 | Disposition: A | Discharge status: Home / Self Care | Payer: BC Managed Care – PPO | Attending: Emergency Medicine | Admitting: Emergency Medicine

## 2022-07-02 ENCOUNTER — Emergency Department: Payer: BC Managed Care – PPO

## 2022-07-02 ENCOUNTER — Other Ambulatory Visit: Payer: Self-pay

## 2022-07-02 DIAGNOSIS — W230XXA Caught, crushed, jammed, or pinched between moving objects, initial encounter: Secondary | ICD-10-CM | POA: Insufficient documentation

## 2022-07-02 DIAGNOSIS — S60121A Contusion of right index finger with damage to nail, initial encounter: Secondary | ICD-10-CM | POA: Diagnosis not present

## 2022-07-02 DIAGNOSIS — Y92009 Unspecified place in unspecified non-institutional (private) residence as the place of occurrence of the external cause: Secondary | ICD-10-CM | POA: Insufficient documentation

## 2022-07-02 DIAGNOSIS — S6991XA Unspecified injury of right wrist, hand and finger(s), initial encounter: Secondary | ICD-10-CM | POA: Diagnosis not present

## 2022-07-02 DIAGNOSIS — S6010XA Contusion of unspecified finger with damage to nail, initial encounter: Secondary | ICD-10-CM

## 2022-07-02 MED ORDER — HYDROCODONE-ACETAMINOPHEN 5-325 MG PO TABS
1.0000 | ORAL_TABLET | Freq: Once | ORAL | Status: AC
  Administered 2022-07-02: 1 via ORAL
  Filled 2022-07-02: qty 1

## 2022-07-02 MED ORDER — HYDROCODONE-ACETAMINOPHEN 5-325 MG PO TABS: 1.0000 | ORAL_TABLET | ORAL | 0 refills | Status: DC | PRN

## 2022-07-02 MED ORDER — MELOXICAM 15 MG PO TABS
15.0000 mg | ORAL_TABLET | Freq: Every day | ORAL | 0 refills | Status: DC
Start: 2022-07-02 — End: 2023-05-20

## 2022-07-02 MED ORDER — MELOXICAM 7.5 MG PO TABS
15.0000 mg | ORAL_TABLET | Freq: Once | ORAL | Status: AC
  Administered 2022-07-02: 15 mg via ORAL
  Filled 2022-07-02: qty 2

## 2022-07-02 NOTE — ED Provider Notes (Signed)
[facility] Provider Note  Patient Contact: 7:53 PM (approximate)   History   Finger Injury (Right Index)   HPI  Eric Soto is a 52 y.o. male who presents emergency department complaining of pain, swelling and ecchymosis to the index finger of his right hand.  Patient states that his finger got slammed in a door at the house.  He states that he has had bloody drainage from under the fingernail itself.  Has been using hydrogen peroxide and states that every time he does so the bleeding increases.  Injury occurred 4 days ago.  Still able to flex and extend the digit.  Patient is concerned that he may have "broken my finger."  No other injury or complaint.     Physical Exam   Triage Vital Signs: ED Triage Vitals  Enc Vitals Group     BP 07/02/22 1952 (!) 140/80     Pulse Rate 07/02/22 1952 75     Resp 07/02/22 1952 16     Temp 07/02/22 1952 98.1 F (36.7 C)     Temp Source 07/02/22 1952 Oral     SpO2 07/02/22 1952 98 %     Weight 07/02/22 1953 196 lb 3.4 oz (89 kg)     Height 07/02/22 1953 5' 11"$  (1.803 m)     Head Circumference --      Peak Flow --      Pain Score 07/02/22 1952 8     Pain Loc --      Pain Edu? --      Excl. in [place]? --     Most recent vital signs: Vitals:   07/02/22 1952  BP: (!) 140/80  Pulse: 75  Resp: 16  Temp: 98.1 F (36.7 C)  SpO2: 98%     General: Alert and in no acute distress.  Cardiovascular:  Good peripheral perfusion Respiratory: Normal respiratory effort without tachypnea or retractions. Lungs CTAB.  Musculoskeletal: Full range of motion to all extremities.  Visualization of the right hand reveals findings consistent with subungual hematoma but several days old.  Patient has dried blood along the medial nail bed consistent with spontaneously draining subungual hematoma.  Able to extend and flex the finger at this time. Neurologic:  No gross focal neurologic deficits are appreciated.  Skin:   No rash  noted Other:   ED Results / Procedures / Treatments   Labs (all labs ordered are listed, but only abnormal results are displayed) Labs Reviewed - No data to display   EKG     RADIOLOGY  I personally viewed, evaluated, and interpreted these images as part of my medical decision making, as well as reviewing the written report by the radiologist.  ED Provider Interpretation: No acute underlying fracture to the index of the right hand  DG Hand Complete Right  Result Date: 07/02/2022 CLINICAL DATA:  Pt finger got jammed in a door Wednesday night about 10pm. He is still able to move his finger. His nail is black per the patient. finger injury EXAM: RIGHT HAND - COMPLETE 3+ VIEW COMPARISON:  None Available. FINDINGS: No evidence of fracture of the carpal or metacarpal bones. Radiocarpal joint is intact. Phalanges are normal. No soft tissue injury. IMPRESSION: No fracture or dislocation. Electronically Signed   By: Suzy Bouchard M.D.   On: 07/02/2022 20:13    PROCEDURES:  Critical Care performed: No  Procedures   MEDICATIONS ORDERED IN ED: Medications  meloxicam (MOBIC) tablet 15 mg (has no  administration in time range)  HYDROcodone-acetaminophen (NORCO/VICODIN) 5-325 MG per tablet 1 tablet (has no administration in time range)     IMPRESSION / MDM / ASSESSMENT AND PLAN / ED COURSE  I reviewed the triage vital signs and the nursing notes.                                 Differential diagnosis includes, but is not limited to, subungual hematoma, distal tuft fracture, tendon rupture  Patient's presentation is most consistent with acute presentation with potential threat to life or bodily function.   Patient's diagnosis is consistent with subungual hematoma.  Patient presents emergency department with what appears to be a subungual hematoma and has been spontaneously draining.  Patient states he has been trying to clean it with peroxide and apparently he has had a buildup,  use of peroxide to loosen some blood and then rebleeds.  No evidence of cellulitis.  Good range of motion preserved.  Patient had x-ray to ensure no underlying fracture and x-rays reassuring.  Patient is given splint for symptom improvement.  As this is spontaneously draining will not open subungual with cautery.  In addition this injury occurred 5 days ago, suspect that placing a intranasal hole would likely not improve symptoms.  Symptom control medication prescribed.  Follow-up primary care as needed..  Patient is given ED precautions to return to the ED for any worsening or new symptoms.     FINAL CLINICAL IMPRESSION(S) / ED DIAGNOSES   Final diagnoses:  Subungual hematoma of digit of hand, initial encounter     Rx / DC Orders   ED Discharge Orders          Ordered    meloxicam (MOBIC) 15 MG tablet  Daily        07/02/22 2055    HYDROcodone-acetaminophen (NORCO/VICODIN) 5-325 MG tablet  Every 4 hours PRN        07/02/22 2055             Note:  This document was prepared using Dragon voice recognition software and may include unintentional dictation errors.   Brynda Peon 07/02/22 2056    Harvest Dark, MD 07/02/22 2320

## 2022-07-02 NOTE — ED Triage Notes (Signed)
Pt to ED from home for finger injury. Pt finger got jammed in a door Wednesday night about 10pm. He is still able to move his finger. His nail is black per the patient. Bandaid removed. Pt is CAOx4 and in no acute distress and ambulatory to room for triage.

## 2022-09-06 DIAGNOSIS — R0789 Other chest pain: Secondary | ICD-10-CM | POA: Diagnosis not present

## 2022-09-06 DIAGNOSIS — R079 Chest pain, unspecified: Secondary | ICD-10-CM | POA: Diagnosis not present

## 2022-09-09 DIAGNOSIS — R079 Chest pain, unspecified: Secondary | ICD-10-CM | POA: Diagnosis not present

## 2022-09-14 DIAGNOSIS — R002 Palpitations: Secondary | ICD-10-CM | POA: Diagnosis not present

## 2022-09-14 DIAGNOSIS — R079 Chest pain, unspecified: Secondary | ICD-10-CM | POA: Diagnosis not present

## 2022-09-14 DIAGNOSIS — F17200 Nicotine dependence, unspecified, uncomplicated: Secondary | ICD-10-CM | POA: Diagnosis not present

## 2022-09-18 DIAGNOSIS — R079 Chest pain, unspecified: Secondary | ICD-10-CM | POA: Diagnosis not present

## 2022-09-25 DIAGNOSIS — R079 Chest pain, unspecified: Secondary | ICD-10-CM | POA: Diagnosis not present

## 2022-09-27 DIAGNOSIS — F17200 Nicotine dependence, unspecified, uncomplicated: Secondary | ICD-10-CM | POA: Diagnosis not present

## 2022-09-27 DIAGNOSIS — R079 Chest pain, unspecified: Secondary | ICD-10-CM | POA: Diagnosis not present

## 2023-01-07 DIAGNOSIS — Z91048 Other nonmedicinal substance allergy status: Secondary | ICD-10-CM | POA: Diagnosis not present

## 2023-01-07 DIAGNOSIS — Z7982 Long term (current) use of aspirin: Secondary | ICD-10-CM | POA: Diagnosis not present

## 2023-01-07 DIAGNOSIS — M5442 Lumbago with sciatica, left side: Secondary | ICD-10-CM | POA: Diagnosis not present

## 2023-01-07 DIAGNOSIS — Z880 Allergy status to penicillin: Secondary | ICD-10-CM | POA: Diagnosis not present

## 2023-01-07 DIAGNOSIS — R000 Tachycardia, unspecified: Secondary | ICD-10-CM | POA: Diagnosis not present

## 2023-01-07 DIAGNOSIS — F1090 Alcohol use, unspecified, uncomplicated: Secondary | ICD-10-CM | POA: Diagnosis not present

## 2023-01-07 DIAGNOSIS — F319 Bipolar disorder, unspecified: Secondary | ICD-10-CM | POA: Diagnosis not present

## 2023-01-07 DIAGNOSIS — Z8739 Personal history of other diseases of the musculoskeletal system and connective tissue: Secondary | ICD-10-CM | POA: Diagnosis not present

## 2023-01-07 DIAGNOSIS — F17210 Nicotine dependence, cigarettes, uncomplicated: Secondary | ICD-10-CM | POA: Diagnosis not present

## 2023-01-07 DIAGNOSIS — Z79899 Other long term (current) drug therapy: Secondary | ICD-10-CM | POA: Diagnosis not present

## 2023-01-07 DIAGNOSIS — G8929 Other chronic pain: Secondary | ICD-10-CM | POA: Diagnosis not present

## 2023-01-07 DIAGNOSIS — M545 Low back pain, unspecified: Secondary | ICD-10-CM | POA: Diagnosis not present

## 2023-01-07 DIAGNOSIS — F1290 Cannabis use, unspecified, uncomplicated: Secondary | ICD-10-CM | POA: Diagnosis not present

## 2023-01-10 DIAGNOSIS — M4316 Spondylolisthesis, lumbar region: Secondary | ICD-10-CM | POA: Diagnosis not present

## 2023-01-10 DIAGNOSIS — M5442 Lumbago with sciatica, left side: Secondary | ICD-10-CM | POA: Diagnosis not present

## 2023-01-10 DIAGNOSIS — M47816 Spondylosis without myelopathy or radiculopathy, lumbar region: Secondary | ICD-10-CM | POA: Diagnosis not present

## 2023-01-10 DIAGNOSIS — M5416 Radiculopathy, lumbar region: Secondary | ICD-10-CM | POA: Diagnosis not present

## 2023-01-10 DIAGNOSIS — M5116 Intervertebral disc disorders with radiculopathy, lumbar region: Secondary | ICD-10-CM | POA: Diagnosis not present

## 2023-01-10 DIAGNOSIS — G8929 Other chronic pain: Secondary | ICD-10-CM | POA: Diagnosis not present

## 2023-01-10 DIAGNOSIS — M5136 Other intervertebral disc degeneration, lumbar region: Secondary | ICD-10-CM | POA: Diagnosis not present

## 2023-01-10 DIAGNOSIS — M4726 Other spondylosis with radiculopathy, lumbar region: Secondary | ICD-10-CM | POA: Diagnosis not present

## 2023-01-10 DIAGNOSIS — M4186 Other forms of scoliosis, lumbar region: Secondary | ICD-10-CM | POA: Diagnosis not present

## 2023-01-10 DIAGNOSIS — M5126 Other intervertebral disc displacement, lumbar region: Secondary | ICD-10-CM | POA: Diagnosis not present

## 2023-01-18 DIAGNOSIS — M5442 Lumbago with sciatica, left side: Secondary | ICD-10-CM | POA: Diagnosis not present

## 2023-01-18 DIAGNOSIS — M5416 Radiculopathy, lumbar region: Secondary | ICD-10-CM | POA: Diagnosis not present

## 2023-01-18 DIAGNOSIS — G8929 Other chronic pain: Secondary | ICD-10-CM | POA: Diagnosis not present

## 2023-01-29 DIAGNOSIS — M5442 Lumbago with sciatica, left side: Secondary | ICD-10-CM | POA: Diagnosis not present

## 2023-01-29 DIAGNOSIS — G8929 Other chronic pain: Secondary | ICD-10-CM | POA: Diagnosis not present

## 2023-01-29 IMAGING — CR DG CHEST 2V
3 series · 3 of 3 positions shown · non-contrast
Comparison: No prior.

CLINICAL DATA: Cough.  Shortness of breath.

EXAM:
CHEST - 2 VIEW

[chest pa (1 of 2)]
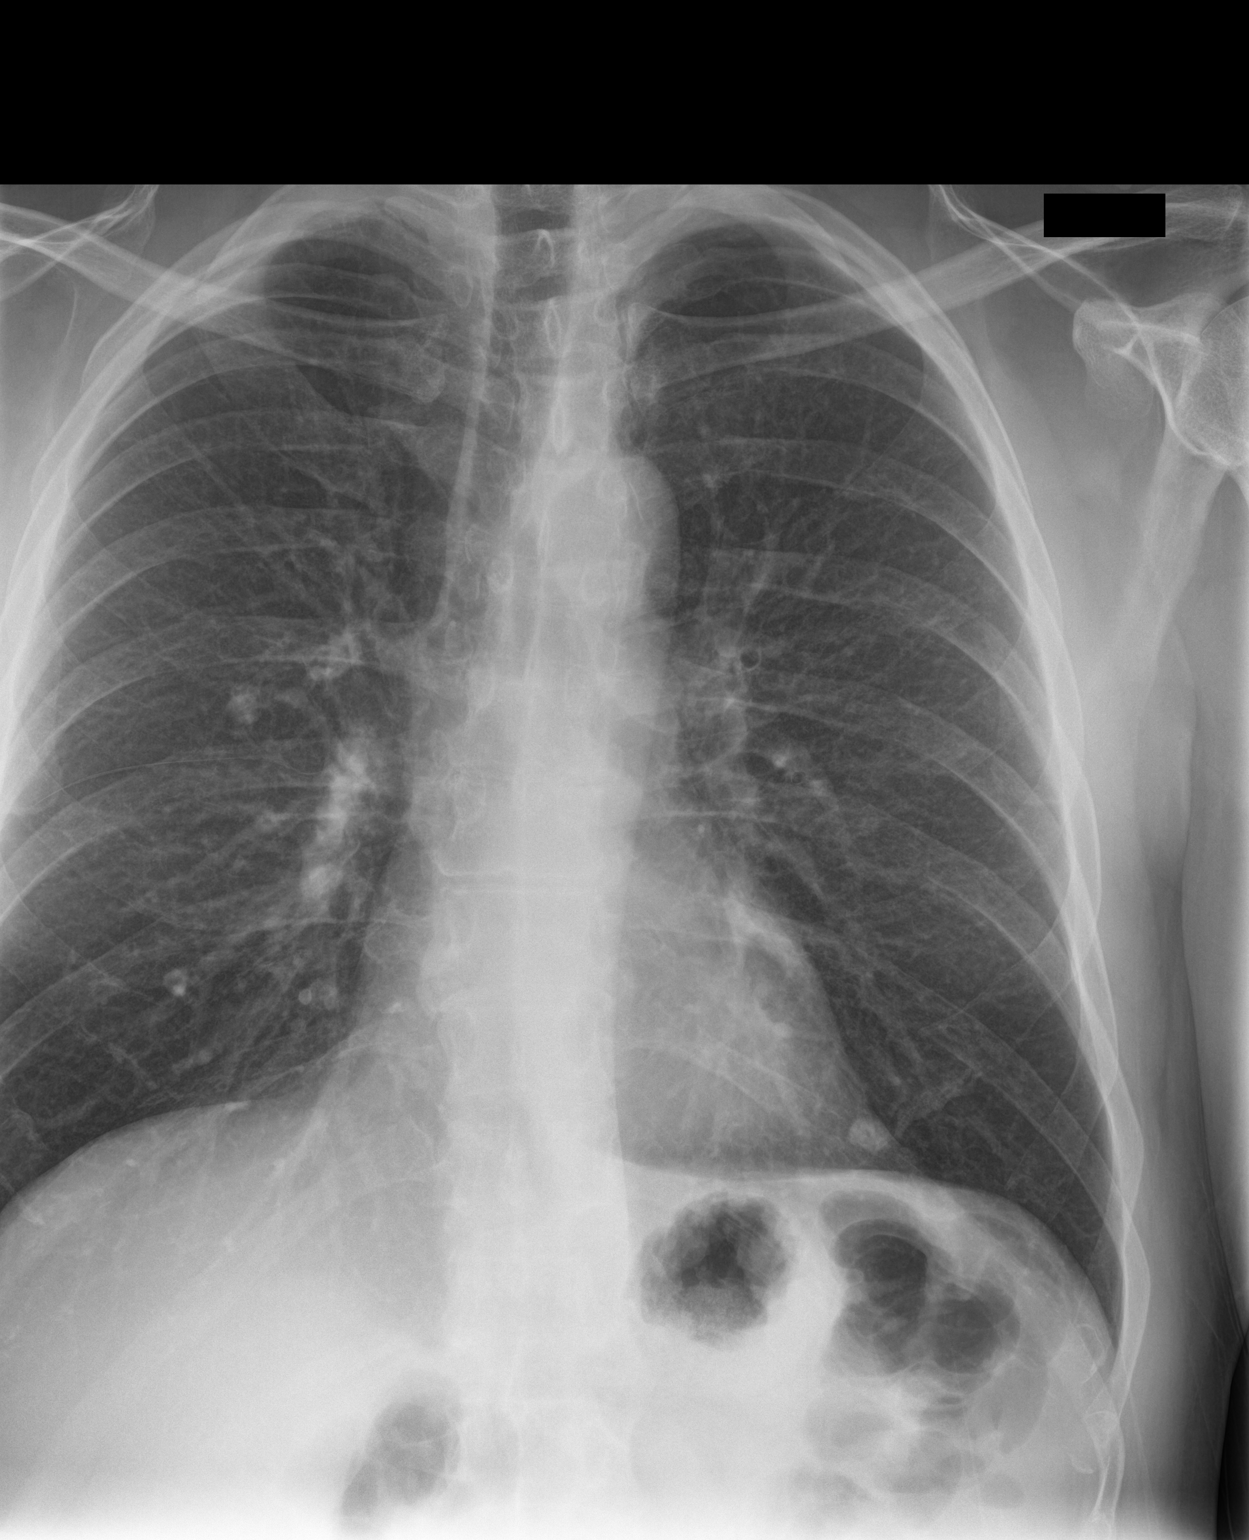

[chest lat]
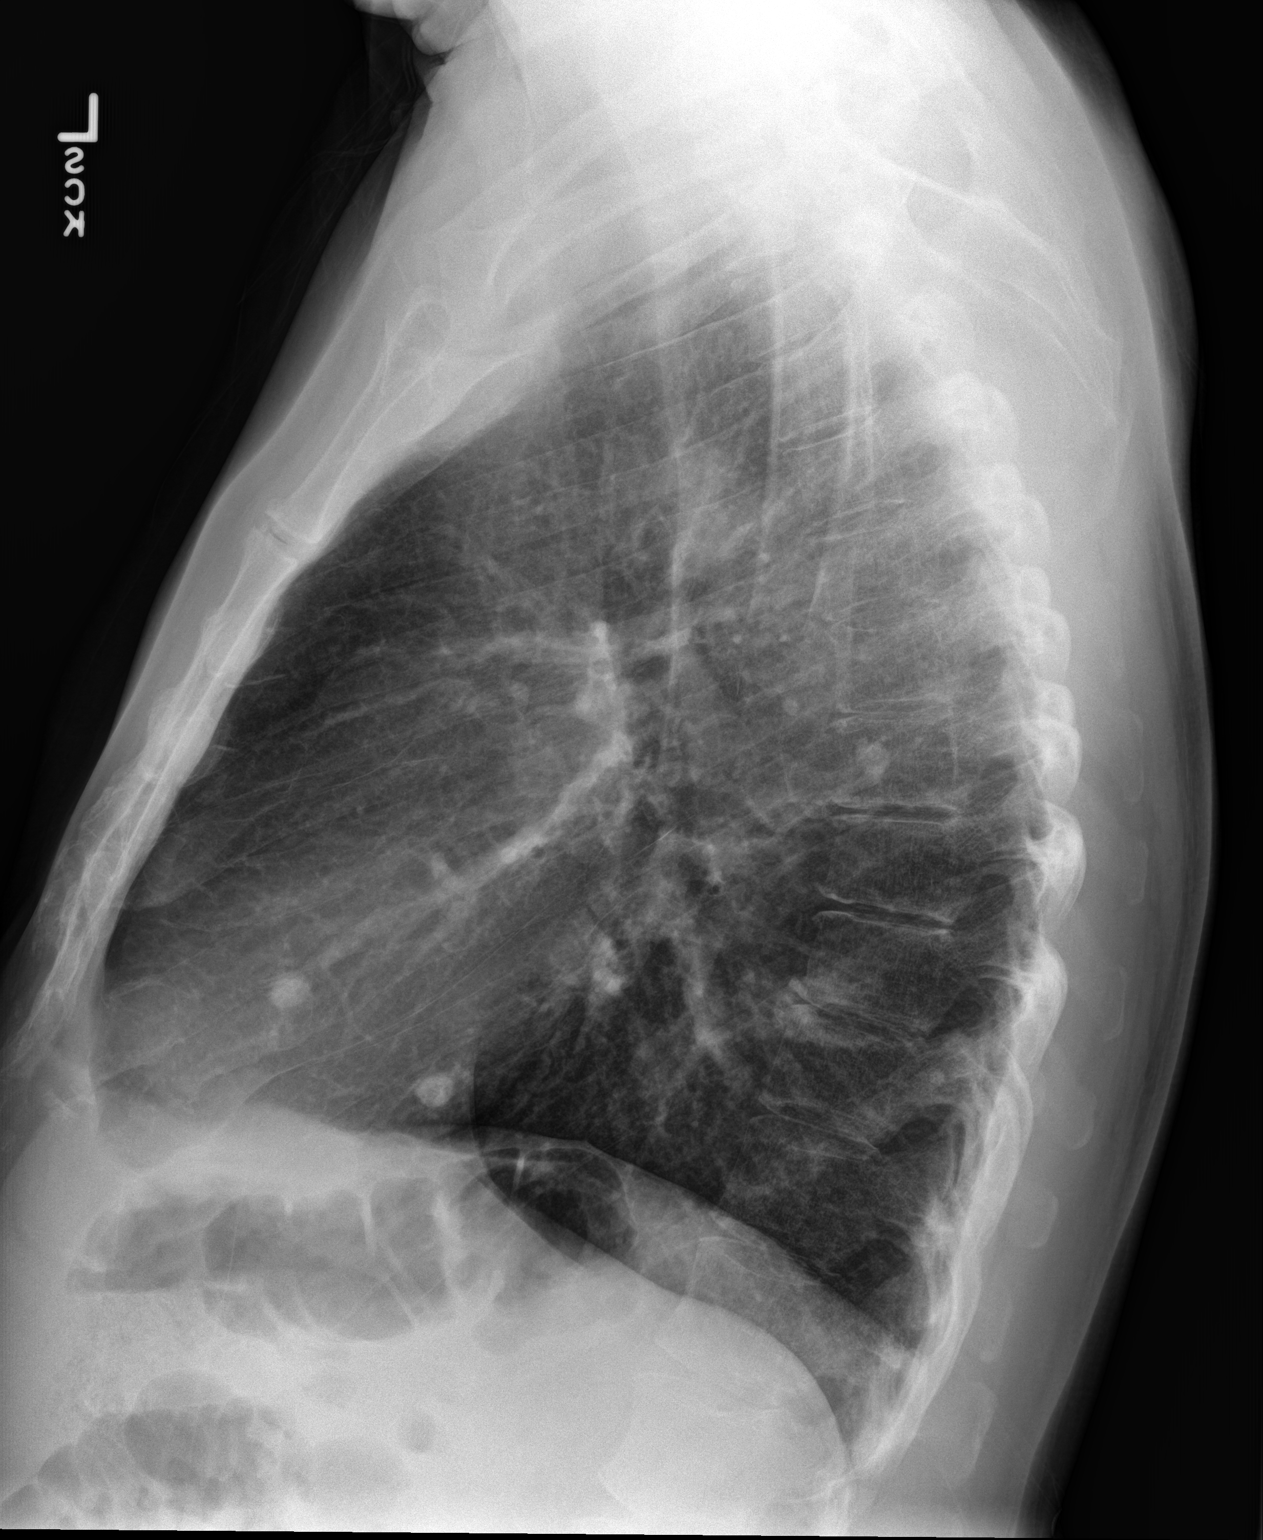

[chest pa (2 of 2)]
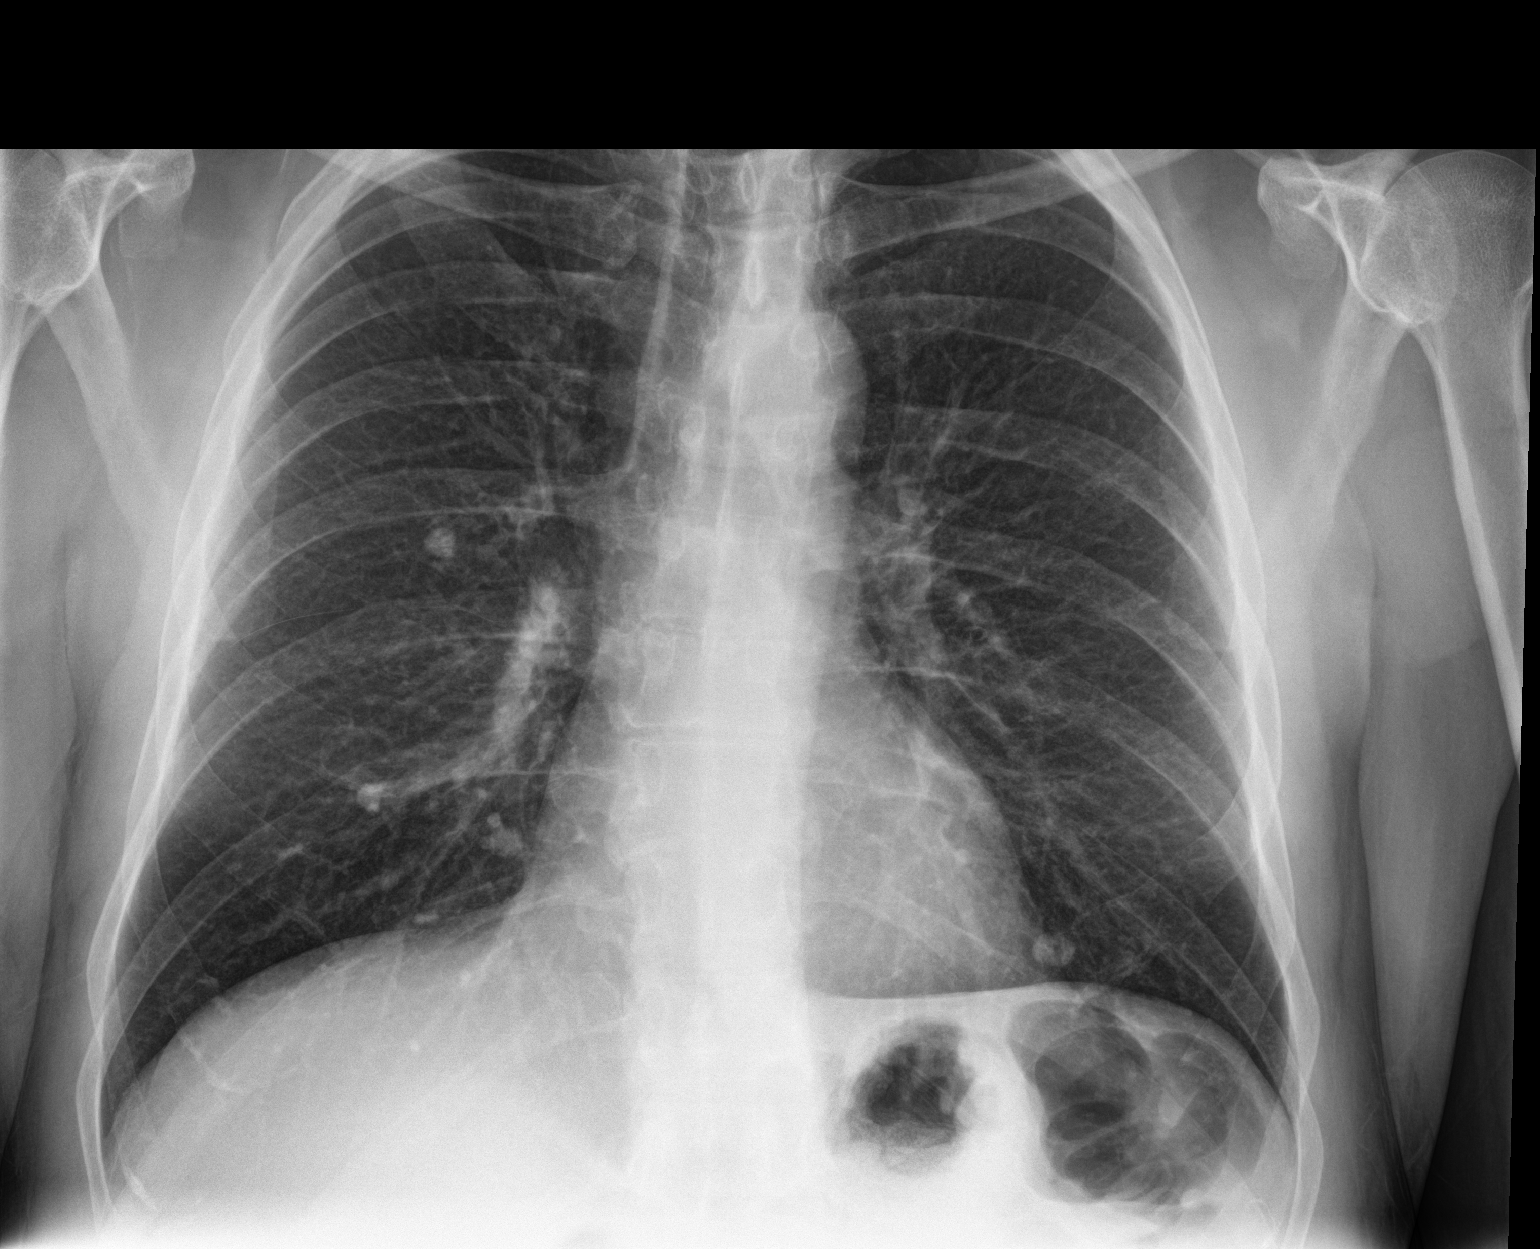

[3 of 3 positions shown; findings below may reference images not displayed]

FINDINGS: Mediastinum and hilar structures normal. Heart size normal.
Calcified pulmonary nodules consistent with granulomas. No pleural
effusion or pneumothorax.
IMPRESSION: No acute cardiopulmonary disease. Calcified pulmonary nodules
consistent with granulomas.

## 2023-02-05 DIAGNOSIS — G8929 Other chronic pain: Secondary | ICD-10-CM | POA: Diagnosis not present

## 2023-02-05 DIAGNOSIS — M5442 Lumbago with sciatica, left side: Secondary | ICD-10-CM | POA: Diagnosis not present

## 2023-05-20 ENCOUNTER — Emergency Department
Admission: EM | Admit: 2023-05-20 | Discharge: 2023-05-20 | Disposition: A | Discharge status: Home / Self Care | Payer: BC Managed Care – PPO | Attending: Emergency Medicine | Admitting: Emergency Medicine

## 2023-05-20 ENCOUNTER — Other Ambulatory Visit: Payer: Self-pay

## 2023-05-20 DIAGNOSIS — J449 Chronic obstructive pulmonary disease, unspecified: Secondary | ICD-10-CM | POA: Insufficient documentation

## 2023-05-20 DIAGNOSIS — M5416 Radiculopathy, lumbar region: Secondary | ICD-10-CM | POA: Insufficient documentation

## 2023-05-20 DIAGNOSIS — M549 Dorsalgia, unspecified: Secondary | ICD-10-CM | POA: Diagnosis present

## 2023-05-20 MED ORDER — LIDOCAINE 5 % EX PTCH: 1.0000 | MEDICATED_PATCH | Freq: Two times a day (BID) | CUTANEOUS | 0 refills | Status: AC

## 2023-05-20 MED ORDER — OXYCODONE-ACETAMINOPHEN 5-325 MG PO TABS
2.0000 | ORAL_TABLET | Freq: Three times a day (TID) | ORAL | 0 refills | Status: DC | PRN
Start: 2023-05-20 — End: 2023-10-21

## 2023-05-20 MED ORDER — IBUPROFEN 800 MG PO TABS: 800.0000 mg | ORAL_TABLET | Freq: Three times a day (TID) | ORAL | 0 refills | Status: AC | PRN

## 2023-05-20 MED ORDER — LIDOCAINE 5 % EX PTCH
1.0000 | MEDICATED_PATCH | Freq: Once | CUTANEOUS | Status: DC
  Administered 2023-05-20: 1 via TRANSDERMAL
  Filled 2023-05-20: qty 1

## 2023-05-20 MED ORDER — KETOROLAC TROMETHAMINE 30 MG/ML IJ SOLN
60.0000 mg | Freq: Once | INTRAMUSCULAR | Status: AC
Start: 2023-05-20 — End: 2023-05-20
  Administered 2023-05-20: 60 mg via INTRAMUSCULAR
  Filled 2023-05-20: qty 2

## 2023-05-20 MED ORDER — ONDANSETRON 4 MG PO TBDP: 4.0000 mg | ORAL_TABLET | Freq: Four times a day (QID) | ORAL | 0 refills | Status: AC | PRN

## 2023-05-20 MED ORDER — METHOCARBAMOL 500 MG PO TABS
1000.0000 mg | ORAL_TABLET | Freq: Once | ORAL | Status: AC
  Administered 2023-05-20: 1000 mg via ORAL
  Filled 2023-05-20: qty 2

## 2023-05-20 MED ORDER — DEXAMETHASONE SODIUM PHOSPHATE 10 MG/ML IJ SOLN
10.0000 mg | Freq: Once | INTRAMUSCULAR | Status: AC
Start: 2023-05-20 — End: 2023-05-20
  Administered 2023-05-20: 10 mg via INTRAMUSCULAR
  Filled 2023-05-20: qty 1

## 2023-05-20 MED ORDER — PREDNISONE 10 MG (21) PO TBPK: ORAL_TABLET | ORAL | 0 refills | Status: AC

## 2023-05-20 NOTE — ED Triage Notes (Signed)
 Pt reports concern for lower left back spasms that have been ongoing since yesterday. Reports similar s/s before. Has taken prescribed lyrica without relief. Denies any new injury/trauma. No urinary s/s. No numbness/tingling to legs.

## 2023-05-20 NOTE — ED Provider Notes (Signed)
 [facility] Provider Note    Event Date/Time   First MD Initiated Contact with Patient 05/20/23 [phone]     (approximate)   History   Back Pain   HPI  Eric Soto is a 53 y.o. male with history of COPD, lumbar radiculopathy status post previous laminectomy who presents to the emergency department with increasing back pain over the past several days.  Denies any known injury.  States his back is spasming.  He has chronic numbness into the left groin which is unchanged.  He has had MRI of his lumbar spine in November 2024 and MRI of his thoracic spine in December 2024 and is followed by neurosurgery.  Has also had a normal EMG of the left lower extremity by neurology as an outpatient recently.  He denies any fever.  No bowel or bladder incontinence.  States he is unable to stand upright due to pain.  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  He is attempting to file for disability.  He reports he is scheduled to see pain management on January 25.  He drove himself to the emergency department and states he does not have anyone to drive him home.   History provided by patient.    Past Medical History:  Diagnosis Date   COPD (chronic obstructive pulmonary disease) (HCC)    Depression     Past Surgical History:  Procedure Laterality Date   BACK SURGERY  1995    MEDICATIONS:  Prior to Admission medications   Medication Sig Start Date End Date Taking? Authorizing Provider  albuterol  (ACCUNEB ) 1.25 MG/3ML nebulizer solution Take 1 ampule by nebulization every 6 (six) hours as needed for wheezing.    [provider]  albuterol  (VENTOLIN  HFA) 108 (90 Base) MCG/ACT inhaler Inhale 2 puffs into the lungs every 6 (six) hours as needed for wheezing or shortness of breath. 01/18/21   Justus Leita DEL, MD  budesonide -formoterol  (SYMBICORT ) 160-4.5 MCG/ACT inhaler  Inhale 2 puffs into the lungs 2 (two) times daily. 01/18/21   Justus Leita DEL, MD  cyclobenzaprine  (FLEXERIL ) 10 MG tablet Take 1 tablet (10 mg total) by mouth 3 (three) times daily as needed. 10/19/21   Triplett, Cari B, FNP  diclofenac  (VOLTAREN ) 75 MG EC tablet Take 1 tablet (75 mg total) by mouth 2 (two) times daily. 11/28/21   Matthews, Jason J, MD  HYDROcodone -acetaminophen  (NORCO/VICODIN) 5-325 MG tablet Take 1 tablet by mouth every 4 (four) hours as needed for moderate pain. 07/02/22 07/02/23  Cuthriell, Jonathan D, PA-C  ipratropium (ATROVENT ) 0.06 % nasal spray Place 2 sprays into both nostrils 4 (four) times daily. 09/14/20   Bernardino Ditch, NP  ketorolac  (TORADOL ) 10 MG tablet Take 1 tablet (10 mg total) by mouth every 6 (six) hours as needed. 10/19/21   Triplett, Cari B, FNP  meloxicam  (MOBIC ) 15 MG tablet Take 1 tablet (15 mg total) by mouth daily. 07/02/22 07/02/23  Cuthriell, Dorn BIRCH, PA-C  Spacer/Aero-Holding Chambers (AEROCHAMBER MV) inhaler Use as instructed 09/14/20   Bernardino Ditch, NP    Physical Exam   Triage Vital Signs: ED Triage Vitals [05/20/23 0517]  Encounter Vitals Group     BP (!) 144/79     Systolic BP Percentile      Diastolic BP Percentile      Pulse Rate 82     Resp (!) 21  Temp 98 F (36.7 C)     Temp Source Oral     SpO2 100 %     Weight 196 lb (88.9 kg)     Height 5' 11 (1.803 m)     Head Circumference      Peak Flow      Pain Score 10     Pain Loc      Pain Education      Exclude from Growth Chart     Most recent vital signs: Vitals:   05/20/23 0517  BP: (!) 144/79  Pulse: 82  Resp: (!) 21  Temp: 98 F (36.7 C)  SpO2: 100%    CONSTITUTIONAL: Alert, responds appropriately to questions.  Appears uncomfortable, afebrile, nontoxic HEAD: Normocephalic, atraumatic EYES: Conjunctivae clear, pupils appear equal, sclera nonicteric ENT: normal nose; moist mucous membranes NECK: Supple, normal ROM CARD: RRR; S1 and S2 appreciated RESP: Normal chest  excursion without splinting or tachypnea; breath sounds clear and equal bilaterally; no wheezes, no rhonchi, no rales, no hypoxia or respiratory distress, speaking full sentences ABD/GI: Non-distended; soft, non-tender, no rebound, no guarding, no peritoneal signs BACK: The back appears normal, no midline spinal tenderness or step-off or deformity, tight paraspinal muscles in the lower lumbar region bilaterally EXT: Normal ROM in all joints; no deformity noted, no edema SKIN: Normal color for age and race; warm; no rash on exposed skin NEURO: Moves all extremities equally, normal speech, reports numbness in the left anterior and medial thigh which she states is chronic.  Otherwise normal sensation diffusely.  2+ deep tendon reflexes in bilateral lower extremities. PSYCH: The patient's mood and manner are appropriate.   ED Results / Procedures / Treatments   LABS: (all labs ordered are listed, but only abnormal results are displayed) Labs Reviewed - No data to display    EKG:  RADIOLOGY: My personal review and interpretation of imaging:    I have personally reviewed all radiology reports.   No results found.   PROCEDURES:  Critical Care performed: No    Procedures    IMPRESSION / MDM / ASSESSMENT AND PLAN / ED COURSE  I reviewed the triage vital signs and the nursing notes.    Patient here for complaints of increasing lower back pain.  History of lumbar radiculopathy status postlaminectomy.    DIFFERENTIAL DIAGNOSIS (includes but not limited to):   Lumbar radiculopathy, muscle spasm, lumbar strain, doubt cauda equina, epidural abscess or hematoma, discitis or osteomyelitis, transverse myelitis, fracture   Patient's presentation is most consistent with acute complicated illness / injury requiring diagnostic workup.   PLAN: Will provide with pain medication here and reassess.  No indication for emergent imaging.  No red flag symptoms.  Anticipate once pain is better  controlled that he can be discharged home with prescription medications and follow-up with his previously established doctors at [facility].   MEDICATIONS GIVEN IN ED: Medications  lidocaine  (LIDODERM ) 5 % 1 patch (1 patch Transdermal Patch Applied 05/20/23 0617)  ketorolac  (TORADOL ) 30 MG/ML injection 60 mg (60 mg Intramuscular Given 05/20/23 0611)  dexamethasone  (DECADRON ) injection 10 mg (10 mg Intramuscular Given 05/20/23 0611)  methocarbamol  (ROBAXIN ) tablet 1,000 mg (1,000 mg Oral Given 05/20/23 0610)     ED COURSE: Patient reports no significant improvement in pain after medications.  He again states that there is no one that can drive him home.  We discussed that I cannot provide him with narcotics and let him drive from the emergency department.  He verbalized understanding  and is appreciative of care here today.  Will discharge with prescription of narcotic pain medication, steroid taper, ibuprofen  and Lidoderm  patches.  He has appropriate providers already following him.  Have offered him a work note which he declines.  Recommended rest, alternating heat and ice, stretching.  At this time, I do not feel there is any life-threatening condition present. I reviewed all nursing notes, vitals, pertinent previous records.  All lab and urine results, EKGs, imaging ordered have been independently reviewed and interpreted by myself.  I reviewed all available radiology reports from any imaging ordered this visit.  Based on my assessment, I feel the patient is safe to be discharged home without further emergent workup and can continue workup as an outpatient as needed. Discussed all findings, treatment plan as well as usual and customary return precautions.  They verbalize understanding and are comfortable with this plan.  Outpatient follow-up has been provided as needed.  All questions have been answered.  CONSULTS:  none   OUTSIDE RECORDS REVIEWED: Reviewed recent neurology and neurosurgery notes.   MRI T  spine 04/24/23:  FINDINGS:  Bone marrow signal intensity is normal. Normal signal in the spinal cord.   Minimal anterolisthesis of T2 on T3. Tiny central and right paracentral disc protrusions at T7-T8, T8-T9, and T9-T10. No significant spinal canal or neural foraminal narrowing.   The paraspinal tissues are within normal limits. Likely tiny epidermal inclusion cyst in the right dorsal subcutaneous soft tissues overlying T4-T5.    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 Notable findings include:   Moderate spinal canal stenosis, moderate right and moderate to severe left neural foraminal stenosis at L4-L5.   Mild spinal canal stenosis, moderate left and mild to moderate right neural foraminal stenosis at L3-L4.   Mild to moderate spinal canal stenosis and mild right neural foraminal stenosis at L2-L3     FINAL CLINICAL IMPRESSION(S) / ED DIAGNOSES   Final diagnoses:  Lumbar radiculopathy     Rx / DC Orders   ED Discharge Orders          Ordered    predniSONE  (STERAPRED UNI-PAK 21 TAB) 10 MG (21) TBPK tablet        05/20/23 0604    ibuprofen  (ADVIL ) 800 MG tablet  Every 8 hours PRN        05/20/23 0604    lidocaine  (LIDODERM ) 5 %  Every 12 hours        05/20/23 0604    oxyCODONE -acetaminophen  (PERCOCET) 5-325 MG tablet  Every 8 hours PRN        05/20/23 0658    ondansetron  (ZOFRAN -ODT) 4 MG disintegrating tablet  Every 6 hours PRN        05/20/23 9341             Note:  This document was prepared using Dragon voice recognition software and may include unintentional dictation errors.   Michel Hendon, Josette SAILOR, DO 05/20/23 [phone]

## 2023-05-20 NOTE — Discharge Instructions (Addendum)
 You are being provided a prescription for opiates (also known as narcotics) for pain control.  Opiates can be addictive and should only be used when absolutely necessary for pain control when other alternatives do not work.  We recommend you only use them for the recommended amount of time and only as prescribed.  Please do not take with other sedative medications or alcohol.  Please do not drive, operate machinery, make important decisions while taking opiates.  Please note that these medications can be addictive and have high abuse potential.  Patients can become addicted to narcotics after only taking them for a few days.  Please keep these medications locked away from children, teenagers or any family members with history of substance abuse.  Narcotic pain medicine may also make you constipated.  You may use over-the-counter medications such as MiraLAX, Colace to prevent constipation.  If you become constipated, you may use over-the-counter enemas as needed.  Itching and nausea are also common side effects of narcotic pain medication.  If you develop uncontrolled vomiting or a rash, please stop these medications and seek medical care.  You may alternate heat and ice to your back.  I recommend gentle stretching, short walks.  Please do not lift anything over 10 pounds for the next several days.  No strenuous exercise.  Please return the emergency department if you begin having worsening and uncontrolled pain, numbness or tingling in your extremities, unable to ambulate, difficulty holding your bowel or bladder, fever.

## 2023-10-21 ENCOUNTER — Emergency Department
Admission: EM | Admit: 2023-10-21 | Discharge: 2023-10-21 | Disposition: A | Discharge status: Home / Self Care | Attending: Emergency Medicine | Admitting: Emergency Medicine

## 2023-10-21 ENCOUNTER — Other Ambulatory Visit: Payer: Self-pay

## 2023-10-21 DIAGNOSIS — J449 Chronic obstructive pulmonary disease, unspecified: Secondary | ICD-10-CM | POA: Insufficient documentation

## 2023-10-21 DIAGNOSIS — M62830 Muscle spasm of back: Secondary | ICD-10-CM | POA: Diagnosis not present

## 2023-10-21 DIAGNOSIS — M545 Low back pain, unspecified: Secondary | ICD-10-CM | POA: Diagnosis present

## 2023-10-21 MED ORDER — LIDOCAINE 5 % EX PTCH
1.0000 | MEDICATED_PATCH | CUTANEOUS | Status: DC
  Administered 2023-10-21: 1 via TRANSDERMAL
  Filled 2023-10-21: qty 1

## 2023-10-21 MED ORDER — METHOCARBAMOL 500 MG PO TABS
1000.0000 mg | ORAL_TABLET | Freq: Once | ORAL | Status: AC
  Administered 2023-10-21: 1000 mg via ORAL
  Filled 2023-10-21: qty 2

## 2023-10-21 MED ORDER — METHOCARBAMOL 500 MG PO TABS: 500.0000 mg | ORAL_TABLET | Freq: Four times a day (QID) | ORAL | 0 refills | Status: AC

## 2023-10-21 MED ORDER — KETOROLAC TROMETHAMINE 30 MG/ML IJ SOLN
30.0000 mg | Freq: Once | INTRAMUSCULAR | Status: AC
  Administered 2023-10-21: 30 mg via INTRAMUSCULAR
  Filled 2023-10-21: qty 1

## 2023-10-21 MED ORDER — METHOCARBAMOL 1000 MG/10ML IJ SOLN
1000.0000 mg | Freq: Once | INTRAMUSCULAR | Status: DC
  Filled 2023-10-21: qty 10

## 2023-10-21 MED ORDER — OXYCODONE HCL 5 MG PO TABS: 5.0000 mg | ORAL_TABLET | Freq: Three times a day (TID) | ORAL | 0 refills | Status: AC | PRN

## 2023-10-21 MED ORDER — OXYCODONE HCL 5 MG PO TABS
5.0000 mg | ORAL_TABLET | Freq: Once | ORAL | Status: AC
  Administered 2023-10-21: 5 mg via ORAL
  Filled 2023-10-21: qty 1

## 2023-10-21 NOTE — Discharge Instructions (Addendum)
 You were evaluated in the ED for muscle spasms of the lower back.  Your physical exam findings were reassuring and we were able to improve your pain some during your visit.  We will continue the medications outpatient and you are encouraged to follow-up with your primary care provider for further management.

## 2023-10-21 NOTE — ED Provider Notes (Signed)
 [facility] Emergency Department Provider Note     Event Date/Time   First MD Initiated Contact with Patient 10/21/23 1519     (approximate)   History   Back Pain (Spasm - )   HPI  Eric Soto is a 53 y.o. male with a past medical history of COPD, lumbar radiculopathy who presents to the ED for evaluation of muscle spasms to his lower back bilaterally that occurred this morning. Characteristics described as severe intermittent, sharp and non radiating pain. Patient reports he drove to [facility]  and back yesterday and due to prolonged sitting, has triggered this spasm. Patient reports he has experienced this before in the past.  States he has come to the ED and received a shot which helped his pain.  Patient reports chronic back pain in which he is followed by neurosurgery at [facility].  Denies injury, loss of bladder or bowel control and new saddle anesthesia outside of his chronic numbness into the left groin area.    Physical Exam   Triage Vital Signs: ED Triage Vitals  Encounter Vitals Group     BP 10/21/23 1516 (!) 158/90     Systolic BP Percentile --      Diastolic BP Percentile --      Pulse Rate 10/21/23 1516 70     Resp 10/21/23 1516 16     Temp 10/21/23 1516 97.7 F (36.5 C)     Temp Source 10/21/23 1516 Oral     SpO2 10/21/23 1516 98 %     Weight --      Height 10/21/23 1515 5\' 11"  (1.803 m)     Head Circumference --      Peak Flow --      Pain Score 10/21/23 1515 8     Pain Loc --      Pain Education --      Exclude from Growth Chart --     Most recent vital signs: Vitals:   10/21/23 1516  BP: (!) 158/90  Pulse: 70  Resp: 16  Temp: 97.7 F (36.5 C)  SpO2: 98%    General: Obvious discomfort alert and oriented. INAD.  Skin:  Warm, dry and intact. No rashes or lesions noted.     Head:  NCAT.  Eyes:  PERRLA. EOMI.  CV:  Good peripheral perfusion.  RESP:  Normal effort.   ABD:  No distention. Soft, Non tender.  No CVA  tenderness bilaterally. BACK:  Spinous process is midline without deformity or tenderness.  No step-off.  Tenderness to bilateral paraspinal muscles of the lumbar region MSK:   Full ROM in all joints. No swelling, deformity or tenderness.  NEURO: Cranial nerves II-XII intact. No focal deficits. Sensation and motor function intact.   ED Results / Procedures / Treatments   Labs (all labs ordered are listed, but only abnormal results are displayed) Labs Reviewed - No data to display  No results found.  PROCEDURES:  Critical Care performed: No  Procedures   MEDICATIONS ORDERED IN ED: Medications  lidocaine  (LIDODERM ) 5 % 1 patch (1 patch Transdermal Patch Applied 10/21/23 1531)  ketorolac  (TORADOL ) 30 MG/ML injection 30 mg (30 mg Intramuscular Given 10/21/23 1532)  methocarbamol  (ROBAXIN ) tablet 1,000 mg (1,000 mg Oral Given 10/21/23 1531)  oxyCODONE  (Oxy IR/ROXICODONE ) immediate release tablet 5 mg (5 mg Oral Given 10/21/23 1619)  oxyCODONE  (Oxy IR/ROXICODONE ) immediate release tablet 5 mg (5 mg Oral Given 10/21/23 1745)     IMPRESSION / MDM /  ASSESSMENT AND PLAN / ED COURSE  I reviewed the triage vital signs and the nursing notes.                               53 y.o. male presents to the emergency department for evaluation and treatment of acute on chronic atraumatic lower back pain. See HPI for further details.   Differential diagnosis includes, but is not limited to lumbar radiculopathy, muscle spasm, strain, cauda equina considered but highly unlikely.  Patient's presentation is most consistent with acute complicated illness / injury requiring diagnostic workup.  Patient is alert and oriented.  He is hemodynamically stable and afebrile.  Physical exam findings are stated above.  Low suspicion for cauda equina or fracture as there is no history of injury.  No indication for imaging at this time.  Chart reviewed.  MRI of his lumbar and thoracic spine performed in November and December  of 2024.  ED pain management with IM Toradol , oxycodone  and lidocaine  patch provided some relief.  Will discharge with prescription continuing this care plan and close follow-up with his PCP for further management.  RICE education provided in which she verbalized understanding.  Patient stable in satisfactory condition for discharge home at this time.  ED return precautions discussed.  All questions concerns were addressed during this ED visit.   FINAL CLINICAL IMPRESSION(S) / ED DIAGNOSES   Final diagnoses:  Muscle spasm of back     Rx / DC Orders   ED Discharge Orders          Ordered    oxyCODONE  (ROXICODONE ) 5 MG immediate release tablet  Every 8 hours PRN        10/21/23 1728    methocarbamol  (ROBAXIN ) 500 MG tablet  4 times daily        10/21/23 1728            Note:  This document was prepared using Dragon voice recognition software and may include unintentional dictation errors.    Phyllis Breeze, Topacio Cella A, PA-C 10/21/23 1750    Bryson Carbine, MD 10/21/23 [phone]

## 2023-10-21 NOTE — ED Triage Notes (Addendum)
 Pt to ed from home via POV for back spasm that started yesterday. Pt states he just drove up today since his brother died. Pt states last time he was here they gave him a injection in his buttocks and some muscle relaxer's. Pt is caox4, in no acute distress and ambulatory in triage.

## 2023-10-23 ENCOUNTER — Telehealth: Payer: Self-pay

## 2023-10-23 NOTE — Transitions of Care (Post Inpatient/ED Visit) (Unsigned)
   10/23/2023  Name: Eric Soto MRN: [id number] DOB: [birth year]  Today's TOC FU Call Status: Today's TOC FU Call Status:: Unsuccessful Call (1st Attempt) Unsuccessful Call (1st Attempt) Date: 10/23/23  Attempted to reach the patient regarding the most recent Inpatient/ED visit.  Follow Up Plan: Additional outreach attempts will be made to reach the patient to complete the Transitions of Care (Post Inpatient/ED visit) call.   [name]  Primary Care & Sports Medicine at [facility], [facility] [address]  [place] [address] Office [phone]  Fax: [phone]

## 2023-10-24 NOTE — Transitions of Care (Post Inpatient/ED Visit) (Signed)
   10/24/2023  Name: Eric Soto MRN: [id number] DOB: [birth year]  Today's TOC FU Call Status: Today's TOC FU Call Status:: Unsuccessful Call (2nd Attempt) Unsuccessful Call (1st Attempt) Date: 10/23/23 Unsuccessful Call (2nd Attempt) Date: 10/24/23  Attempted to reach the patient regarding the most recent Inpatient/ED visit.  Follow Up Plan: Additional outreach attempts will be made to reach the patient to complete the Transitions of Care (Post Inpatient/ED visit) call.   Tanielle Emigh Concordia  Primary Care & Sports Medicine at [facility], [facility] [address]  [place] [address] Office [phone]  Fax: [phone]

## 2024-03-12 ENCOUNTER — Other Ambulatory Visit: Payer: Self-pay

## 2024-03-12 ENCOUNTER — Emergency Department
Admission: EM | Admit: 2024-03-12 | Discharge: 2024-03-12 | Disposition: A | Discharge status: Home / Self Care | Attending: Emergency Medicine | Admitting: Emergency Medicine

## 2024-03-12 ENCOUNTER — Encounter: Payer: Self-pay | Admitting: Emergency Medicine

## 2024-03-12 DIAGNOSIS — G8929 Other chronic pain: Secondary | ICD-10-CM | POA: Insufficient documentation

## 2024-03-12 DIAGNOSIS — M549 Dorsalgia, unspecified: Secondary | ICD-10-CM

## 2024-03-12 DIAGNOSIS — J449 Chronic obstructive pulmonary disease, unspecified: Secondary | ICD-10-CM | POA: Diagnosis not present

## 2024-03-12 DIAGNOSIS — M545 Low back pain, unspecified: Secondary | ICD-10-CM | POA: Diagnosis present

## 2024-03-12 MED ORDER — LIDOCAINE 5 % EX PTCH
1.0000 | MEDICATED_PATCH | Freq: Once | CUTANEOUS | Status: DC
  Administered 2024-03-12: 1 via TRANSDERMAL
  Filled 2024-03-12: qty 1

## 2024-03-12 MED ORDER — CYCLOBENZAPRINE HCL 10 MG PO TABS: 10.0000 mg | ORAL_TABLET | Freq: Three times a day (TID) | ORAL | 0 refills | Status: AC | PRN

## 2024-03-12 MED ORDER — CYCLOBENZAPRINE HCL 10 MG PO TABS
10.0000 mg | ORAL_TABLET | Freq: Once | ORAL | Status: AC
  Administered 2024-03-12: 10 mg via ORAL
  Filled 2024-03-12: qty 1

## 2024-03-12 MED ORDER — KETOROLAC TROMETHAMINE 15 MG/ML IJ SOLN
15.0000 mg | Freq: Once | INTRAMUSCULAR | Status: AC
  Administered 2024-03-12: 15 mg via INTRAMUSCULAR
  Filled 2024-03-12: qty 1

## 2024-03-12 MED ORDER — MELOXICAM 15 MG PO TABS: 15.0000 mg | ORAL_TABLET | Freq: Every day | ORAL | 0 refills | Status: AC

## 2024-03-12 MED ORDER — OXYCODONE-ACETAMINOPHEN 5-325 MG PO TABS
1.0000 | ORAL_TABLET | Freq: Once | ORAL | Status: AC
  Administered 2024-03-12: 1 via ORAL
  Filled 2024-03-12: qty 1

## 2024-03-12 MED ORDER — OXYCODONE-ACETAMINOPHEN 5-325 MG PO TABS: 1.0000 | ORAL_TABLET | ORAL | 0 refills | Status: AC | PRN

## 2024-03-12 NOTE — ED Triage Notes (Addendum)
 Pt arrives POV, ambulatory to triage, gait slow and steady, no acute distress noted c/o lower back spasms w/ radiation to left leg x3 days. Pt reports missing discs in back. Denies medications for back pain. Pt reports taking six goody powders today, no relief. Denies injury

## 2024-03-12 NOTE — ED Provider Notes (Signed)
 [facility] Provider Note    Event Date/Time   First MD Initiated Contact with Patient 03/12/24 2117     (approximate)   History   Back Pain   HPI  Eric Soto is a 53 y.o. male history of COPD, depression presents emergency department with exacerbation of chronic back pain.  Patient states happens a couple times a year.  Starts having large spasm in the lower back that is a twisting type pain.  States he does need to have surgery but is trying to put it off.  Denies loss of bowel or bladder control.  No numbness tingling that is new.  Took 4 Goody powders today without relief.  States he usually gets a shot of Toradol  and muscle relaxer and starts to do better      Physical Exam   Triage Vital Signs: ED Triage Vitals  Encounter Vitals Group     BP 03/12/24 2106 (!) 178/95     Girls Systolic BP Percentile --      Girls Diastolic BP Percentile --      Boys Systolic BP Percentile --      Boys Diastolic BP Percentile --      Pulse Rate 03/12/24 2106 69     Resp --      Temp 03/12/24 2106 98.8 F (37.1 C)     Temp Source 03/12/24 2106 Oral     SpO2 03/12/24 2106 100 %     Weight --      Height --      Head Circumference --      Peak Flow --      Pain Score 03/12/24 2107 9     Pain Loc --      Pain Education --      Exclude from Growth Chart --     Most recent vital signs: Vitals:   03/12/24 2106  BP: (!) 178/95  Pulse: 69  Temp: 98.8 F (37.1 C)  SpO2: 100%     General: Awake, no distress.   CV:  Good peripheral perfusion.  Resp:  Normal effort.  Abd:  No distention.   Other:  Spasm noted in the left lower back, patient able to walk without foot drop, neurovascular intact   ED Results / Procedures / Treatments   Labs (all labs ordered are listed, but only abnormal results are displayed) Labs Reviewed - No data to display   EKG     RADIOLOGY     PROCEDURES:   Procedures  Critical Care:  no Chief  Complaint  Patient presents with   Back Pain      MEDICATIONS ORDERED IN ED: Medications  lidocaine  (LIDODERM ) 5 % 1 patch (1 patch Transdermal Patch Applied 03/12/24 2135)  ketorolac  (TORADOL ) 15 MG/ML injection 15 mg (15 mg Intramuscular Given 03/12/24 2135)  cyclobenzaprine  (FLEXERIL ) tablet 10 mg (10 mg Oral Given 03/12/24 2136)  oxyCODONE -acetaminophen  (PERCOCET/ROXICET) 5-325 MG per tablet 1 tablet (1 tablet Oral Given 03/12/24 2136)     IMPRESSION / MDM / ASSESSMENT AND PLAN / ED COURSE  I reviewed the triage vital signs and the nursing notes.                              Differential diagnosis includes, but is not limited to, muscle spasm, chronic back pain, cauda equina, lumbar radiculopathy  Patient's presentation is most consistent with acute illness / injury with system symptoms.  Medications given: Toradol  15 mg IM, Flexeril  10 mg p.o., Lidoderm  patch, Percocet 1 p.o.  Patient has chronic history of back pain.  Do not feel that he has cauda equina disease had no loss of bowel or bladder control.  Patient is very insistent that it is more of a muscle spasm.  Has had medications here in the past that worked well.  In review of his old chart it appears that he has had Toradol  and muscle relaxer along with Lidoderm  patch and Percocet which has had helped his pain.  Systems reviewed and we will go ahead and discharge him with meloxicam , Flexeril , and just a few Percocet to get him through.  Follow-up with his regular doctor.  Return if worsening.  He is in agreement treatment plan.  Discharged stable condition.      FINAL CLINICAL IMPRESSION(S) / ED DIAGNOSES   Final diagnoses:  Acute on chronic back pain     Rx / DC Orders   ED Discharge Orders          Ordered    cyclobenzaprine  (FLEXERIL ) 10 MG tablet  3 times daily PRN        03/12/24 2159    meloxicam  (MOBIC ) 15 MG tablet  Daily        03/12/24 2159    oxyCODONE -acetaminophen  (PERCOCET) 5-325 MG tablet   Every 4 hours PRN        03/12/24 2159             Note:  This document was prepared using Dragon voice recognition software and may include unintentional dictation errors.    Gasper Devere ORN, PA-C 03/12/24 2307    Willo Dunnings, MD 03/12/24 [phone]
